# Patient Record
Sex: Female | Born: 1955 | Race: Black or African American | Hispanic: No | Marital: Married | Smoking: Current some day smoker
Health system: Southern US, Community
[De-identification: ages and names within clinical notes are randomized; demographics above are authoritative.]

## PROBLEM LIST (undated history)

## (undated) ENCOUNTER — Encounter

## (undated) ENCOUNTER — Ambulatory Visit

## (undated) ENCOUNTER — Encounter: Attending: Family Medicine | Primary: Family Medicine

## (undated) ENCOUNTER — Ambulatory Visit
Attending: Student in an Organized Health Care Education/Training Program | Primary: Student in an Organized Health Care Education/Training Program

## (undated) ENCOUNTER — Telehealth

## (undated) ENCOUNTER — Ambulatory Visit
Payer: MEDICARE | Attending: Pharmacist Clinician (PhC)/ Clinical Pharmacy Specialist | Primary: Pharmacist Clinician (PhC)/ Clinical Pharmacy Specialist

## (undated) ENCOUNTER — Encounter
Attending: Pharmacist Clinician (PhC)/ Clinical Pharmacy Specialist | Primary: Pharmacist Clinician (PhC)/ Clinical Pharmacy Specialist

## (undated) ENCOUNTER — Ambulatory Visit: Payer: MEDICARE

## (undated) ENCOUNTER — Telehealth
Attending: Student in an Organized Health Care Education/Training Program | Primary: Student in an Organized Health Care Education/Training Program

## (undated) ENCOUNTER — Ambulatory Visit: Payer: Medicare (Managed Care) | Attending: Family Medicine | Primary: Family Medicine

## (undated) ENCOUNTER — Encounter
Attending: Student in an Organized Health Care Education/Training Program | Primary: Student in an Organized Health Care Education/Training Program

## (undated) ENCOUNTER — Ambulatory Visit: Attending: Pharmacist | Primary: Pharmacist

## (undated) ENCOUNTER — Encounter: Attending: Pharmacist | Primary: Pharmacist

## (undated) ENCOUNTER — Telehealth
Attending: Pharmacist Clinician (PhC)/ Clinical Pharmacy Specialist | Primary: Pharmacist Clinician (PhC)/ Clinical Pharmacy Specialist

## (undated) ENCOUNTER — Ambulatory Visit: Payer: Medicare (Managed Care)

## (undated) ENCOUNTER — Encounter: Attending: General Practice | Primary: General Practice

## (undated) DIAGNOSIS — F17200 Nicotine dependence, unspecified, uncomplicated: Secondary | ICD-10-CM

---

## 2001-02-10 HISTORY — PX: ABDOMINAL HYSTERECTOMY: SHX81

## 2016-06-09 ENCOUNTER — Encounter: Payer: Self-pay | Admitting: Medical Oncology

## 2016-06-09 ENCOUNTER — Emergency Department: Payer: Self-pay

## 2016-06-09 ENCOUNTER — Inpatient Hospital Stay
Admission: EM | Admit: 2016-06-09 | Discharge: 2016-06-10 | DRG: 065 | Disposition: A | Discharge status: Home Health | Payer: Self-pay | Attending: Internal Medicine | Admitting: Internal Medicine

## 2016-06-09 DIAGNOSIS — R42 Dizziness and giddiness: Secondary | ICD-10-CM

## 2016-06-09 DIAGNOSIS — R270 Ataxia, unspecified: Secondary | ICD-10-CM | POA: Diagnosis present

## 2016-06-09 DIAGNOSIS — I639 Cerebral infarction, unspecified: Principal | ICD-10-CM

## 2016-06-09 DIAGNOSIS — R4701 Aphasia: Secondary | ICD-10-CM | POA: Diagnosis present

## 2016-06-09 DIAGNOSIS — G8191 Hemiplegia, unspecified affecting right dominant side: Secondary | ICD-10-CM | POA: Diagnosis present

## 2016-06-09 DIAGNOSIS — R002 Palpitations: Secondary | ICD-10-CM | POA: Diagnosis present

## 2016-06-09 DIAGNOSIS — Z880 Allergy status to penicillin: Secondary | ICD-10-CM

## 2016-06-09 DIAGNOSIS — F17210 Nicotine dependence, cigarettes, uncomplicated: Secondary | ICD-10-CM | POA: Diagnosis present

## 2016-06-09 DIAGNOSIS — R739 Hyperglycemia, unspecified: Secondary | ICD-10-CM | POA: Diagnosis present

## 2016-06-09 DIAGNOSIS — Z885 Allergy status to narcotic agent status: Secondary | ICD-10-CM

## 2016-06-09 DIAGNOSIS — R200 Anesthesia of skin: Secondary | ICD-10-CM

## 2016-06-09 DIAGNOSIS — Z716 Tobacco abuse counseling: Secondary | ICD-10-CM

## 2016-06-09 DIAGNOSIS — R251 Tremor, unspecified: Secondary | ICD-10-CM

## 2016-06-09 DIAGNOSIS — E785 Hyperlipidemia, unspecified: Secondary | ICD-10-CM | POA: Diagnosis present

## 2016-06-09 HISTORY — DX: Nicotine dependence, unspecified, uncomplicated: F17.200

## 2016-06-09 LAB — URINE DRUG SCREEN, QUALITATIVE (ARMC ONLY)
AMPHETAMINES, UR SCREEN: NOT DETECTED
BENZODIAZEPINE, UR SCRN: NOT DETECTED
Barbiturates, Ur Screen: NOT DETECTED
Cannabinoid 50 Ng, Ur ~~LOC~~: NOT DETECTED
Cocaine Metabolite,Ur ~~LOC~~: NOT DETECTED
MDMA (Ecstasy)Ur Screen: NOT DETECTED
Methadone Scn, Ur: NOT DETECTED
OPIATE, UR SCREEN: NOT DETECTED
Phencyclidine (PCP) Ur S: NOT DETECTED
Tricyclic, Ur Screen: NOT DETECTED

## 2016-06-09 LAB — COMPREHENSIVE METABOLIC PANEL
ALBUMIN: 4.4 g/dL (ref 3.5–5.0)
ALT: 22 U/L (ref 14–54)
ANION GAP: 14 (ref 5–15)
AST: 35 U/L (ref 15–41)
Alkaline Phosphatase: 59 U/L (ref 38–126)
BILIRUBIN TOTAL: 0.9 mg/dL (ref 0.3–1.2)
BUN: 8 mg/dL (ref 6–20)
CO2: 23 mmol/L (ref 22–32)
Calcium: 9.9 mg/dL (ref 8.9–10.3)
Chloride: 102 mmol/L (ref 101–111)
Creatinine, Ser: 0.84 mg/dL (ref 0.44–1.00)
GFR calc non Af Amer: >60 mL/min (ref >60)
GLUCOSE: 101 mg/dL — AB (ref 65–99)
POTASSIUM: 4.3 mmol/L (ref 3.5–5.1)
SODIUM: 139 mmol/L (ref 135–145)
TOTAL PROTEIN: 8.7 g/dL — AB (ref 6.5–8.1)

## 2016-06-09 LAB — CBC WITH DIFFERENTIAL/PLATELET
BASOS PCT: 1 %
Basophils Absolute: 0 10*3/uL (ref 0–0.1)
EOS ABS: 0 10*3/uL (ref 0–0.7)
Eosinophils Relative: 1 %
HEMATOCRIT: 44.2 % (ref 35.0–47.0)
Hemoglobin: 14.9 g/dL (ref 12.0–16.0)
Lymphocytes Relative: 20 %
Lymphs Abs: 1.3 10*3/uL (ref 1.0–3.6)
MCH: 30.1 pg (ref 26.0–34.0)
MCHC: 33.6 g/dL (ref 32.0–36.0)
MCV: 89.5 fL (ref 80.0–100.0)
MONO ABS: 0.4 10*3/uL (ref 0.2–0.9)
MONOS PCT: 7 %
Neutro Abs: 4.6 10*3/uL (ref 1.4–6.5)
Neutrophils Relative %: 71 %
Platelets: 292 10*3/uL (ref 150–440)
RBC: 4.94 MIL/uL (ref 3.80–5.20)
RDW: 13.9 % (ref 11.5–14.5)
WBC: 6.4 10*3/uL (ref 3.6–11.0)

## 2016-06-09 LAB — TSH
TSH: 1.994 u[IU]/mL (ref 0.350–4.500)
TSH: 1.988 u[IU]/mL (ref 0.350–4.500)

## 2016-06-09 LAB — TROPONIN I: Troponin I: <0.03 ng/mL (ref <0.03)

## 2016-06-09 MED ORDER — DOCUSATE SODIUM 100 MG PO CAPS
100.0000 mg | ORAL_CAPSULE | Freq: Two times a day (BID) | ORAL | Status: DC
  Administered 2016-06-09 – 2016-06-10 (×2): 100 mg via ORAL
  Filled 2016-06-09 (×2): qty 1

## 2016-06-09 MED ORDER — ONDANSETRON HCL 4 MG/2ML IJ SOLN
4.0000 mg | Freq: Once | INTRAMUSCULAR | Status: AC
  Administered 2016-06-09: 4 mg via INTRAVENOUS
  Filled 2016-06-09: qty 2

## 2016-06-09 MED ORDER — ONDANSETRON HCL 4 MG/2ML IJ SOLN: 4.0000 mg | Freq: Four times a day (QID) | INTRAMUSCULAR | Status: DC | PRN

## 2016-06-09 MED ORDER — ASPIRIN EC 325 MG PO TBEC
325.0000 mg | DELAYED_RELEASE_TABLET | Freq: Every day | ORAL | Status: DC
  Administered 2016-06-09 – 2016-06-10 (×2): 325 mg via ORAL
  Filled 2016-06-09 (×2): qty 1

## 2016-06-09 MED ORDER — SODIUM CHLORIDE 0.9 % IV SOLN
INTRAVENOUS | Status: DC
  Administered 2016-06-09 – 2016-06-10 (×3): via INTRAVENOUS

## 2016-06-09 MED ORDER — SODIUM CHLORIDE 0.9 % IV BOLUS (SEPSIS)
1000.0000 mL | Freq: Once | INTRAVENOUS | Status: AC
  Administered 2016-06-09: 1000 mL via INTRAVENOUS

## 2016-06-09 MED ORDER — MECLIZINE HCL 25 MG PO TABS: 25.0000 mg | ORAL_TABLET | Freq: Three times a day (TID) | ORAL | Status: DC | PRN

## 2016-06-09 MED ORDER — NICOTINE 14 MG/24HR TD PT24
14.0000 mg | MEDICATED_PATCH | Freq: Every day | TRANSDERMAL | Status: DC
  Administered 2016-06-09 – 2016-06-10 (×2): 14 mg via TRANSDERMAL
  Filled 2016-06-09 (×2): qty 1

## 2016-06-09 MED ORDER — ATORVASTATIN CALCIUM 20 MG PO TABS
40.0000 mg | ORAL_TABLET | Freq: Every day | ORAL | Status: DC
  Administered 2016-06-09 – 2016-06-10 (×2): 40 mg via ORAL
  Filled 2016-06-09 (×2): qty 2

## 2016-06-09 MED ORDER — ACETAMINOPHEN 325 MG PO TABS: 650.0000 mg | ORAL_TABLET | Freq: Four times a day (QID) | ORAL | Status: DC | PRN

## 2016-06-09 MED ORDER — SODIUM CHLORIDE 0.9% FLUSH
3.0000 mL | Freq: Two times a day (BID) | INTRAVENOUS | Status: DC
  Administered 2016-06-09 – 2016-06-10 (×2): 3 mL via INTRAVENOUS

## 2016-06-09 MED ORDER — ONDANSETRON HCL 4 MG PO TABS: 4.0000 mg | ORAL_TABLET | Freq: Four times a day (QID) | ORAL | Status: DC | PRN

## 2016-06-09 MED ORDER — ACETAMINOPHEN 650 MG RE SUPP: 650.0000 mg | Freq: Four times a day (QID) | RECTAL | Status: DC | PRN

## 2016-06-09 MED ORDER — ENOXAPARIN SODIUM 40 MG/0.4ML ~~LOC~~ SOLN
40.0000 mg | SUBCUTANEOUS | Status: DC
  Administered 2016-06-09: 40 mg via SUBCUTANEOUS
  Filled 2016-06-09: qty 0.4

## 2016-06-09 NOTE — ED Notes (Signed)
Pt is A&O x4 with no shaking noted - pt denies any weakness to either side and denies N/V at this time - pt denies any pain

## 2016-06-09 NOTE — ED Provider Notes (Signed)
[facility] Emergency Department Provider Note  ____________________________________________   First MD Initiated Contact with Patient 06/09/16 0920     (approximate)  I have reviewed the triage vital signs and the nursing notes.   HISTORY  Chief Complaint Dizziness; Shaking; and Nausea   HPI Nicole Moses is a 61 y.o. female without any chronic medical issues who is presenting to the emergency department with sudden onset tremors. She says that she was about to do her dishes in her home just prior to arrival today when she started experiencing shaking to her bilateral upper extremities with the right being greater than the left. She says that she was not anxious. She says that she does not drink on a regular basis although she did 3 beers yesterday because it was her son's 81th birthday. She is denying any pain. Says that she does feel lightheaded and especially when she lays her head back feels lightheaded with nausea. She says that she also has tingling in her feet, bilaterally. Says that she feels palpitations in her chest but with no pain or shortness of breath. Says that she does not feel anxious or have a history of anxiety or psychiatric disorder. Denies any drug use.   History reviewed. No pertinent past medical history.  There are no active problems to display for this patient.   No past surgical history on file.  Prior to Admission medications   Not on File    Allergies Codeine and Penicillins  No family history on file.  Social History Social History  Substance Use Topics  . Smoking status: Not on file  . Smokeless tobacco: Not on file  . Alcohol use Not on file    Review of Systems  Constitutional: No fever/chills Eyes: No visual changes. ENT: No sore throat. Cardiovascular: Denies chest pain. Respiratory: Denies shortness of breath. Gastrointestinal: No abdominal pain. no vomiting.  No diarrhea.  No  constipation. Genitourinary: Negative for dysuria. Musculoskeletal: Negative for back pain. Skin: Negative for rash. Neurological: Negative for headaches, focal weakness or numbness.   ____________________________________________   PHYSICAL EXAM:  VITAL SIGNS: ED Triage Vitals  Enc Vitals Group     BP 06/09/16 0915 (!) 160/98     Pulse Rate 06/09/16 0915 (!) 105     Resp 06/09/16 0915 20     Temp 06/09/16 0915 97.5 F (36.4 C)     Temp Source 06/09/16 0915 Oral     SpO2 06/09/16 0915 100 %     Weight 06/09/16 0916 145 lb (65.8 kg)     Height 06/09/16 0916  (1.499 m)     Head Circumference --      Peak Flow --      Pain Score --      Pain Loc --      Pain Edu? --      Excl. in GC? --     Constitutional: Alert and oriented.  Eyes: Conjunctivae are normal. PERRL. EOMI. Head: Atraumatic. Nose: No congestion/rhinnorhea. Mouth/Throat: Mucous membranes are moist.   Neck: No stridor.   Cardiovascular: tachycardic, regular rhythm. Grossly normal heart sounds.  Good peripheral circulation with equal and bilateral dorsalis pedis pulses. Respiratory: Normal respiratory effort.  No retractions. Lungs CTAB. Gastrointestinal: Soft and nontender. No distention.No CVA tenderness. Musculoskeletal: No lower extremity tenderness nor edema.  No joint effusions. Neurologic:  Normal speech and language. No gross focal neurologic deficits are appreciated. Full strength throughout but with tremulousness with the right upper and lower  extremities being greater the left side. Tremor stops intermittently but is worse with movement.No sensory deficit to light touch the patient says that she does feel "tingling in her bilateral feet. 5 out of 5 strength throughout. No facial asymmetry. Skin:  Skin is warm, dry and intact. No rash noted. Psychiatric: Mood and affect are normal. Speech and behavior are normal.  ____________________________________________   LABS (all labs ordered are listed,  but only abnormal results are displayed)  Labs Reviewed  COMPREHENSIVE METABOLIC PANEL - Abnormal; Notable for the following:       Result Value   Glucose, Bld 101 (*)    Total Protein 8.7 (*)    All other components within normal limits  CBC WITH DIFFERENTIAL/PLATELET  TROPONIN I  URINE DRUG SCREEN, QUALITATIVE ([facility] ONLY)  TSH   ____________________________________________  EKG  ED ECG REPORT I, Arelia Longest, the attending physician, personally viewed and interpreted this ECG.   Date: 06/09/2016  EKG Time: 0913  Rate: 104  Rhythm: sinus tachycardia  Axis: Left  Intervals:none  ST&T Change: No ST segment elevation or depression. No abnormal T-wave inversion.  ____________________________________________  RADIOLOGY    DG Chest 1 View (Final result)  Result time 06/09/16 09:56:39  Final result by Florencia Reasons, MD (06/09/16 09:56:39)           Narrative:   CLINICAL DATA: 61 year old female with history of shakiness and dizziness. Vomiting.  EXAM: CHEST 1 VIEW  COMPARISON: No priors.  FINDINGS: Diffuse peribronchial cuffing, most evident throughout the mid to lower lungs bilaterally where there is also diffuse interstitial prominence. Lung volumes are slightly low. No consolidative airspace disease. No pleural effusions. No pneumothorax. No pulmonary nodule or mass noted. Pulmonary vasculature and the cardiomediastinal silhouette are within normal limits. Atherosclerosis in the thoracic aorta.  IMPRESSION: 1. The appearance the chest suggests an acute bronchitis. The possibility of underlying interstitial lung disease should also be considered given the low lung volumes and basal predominance of findings, but is not strongly favored at this time. Repeat standing PA and lateral chest radiograph is recommended in 2-3 weeks after treatment to ensure resolution of these findings.   Electronically Signed By: Trudie Reed M.D. On:  06/09/2016 09:56            CT Head Wo Contrast (Final result)  Result time 06/09/16 09:46:52  Final result by Herbie Baltimore, MD (06/09/16 09:46:52)           Narrative:   CLINICAL DATA: Sudden onset nausea, headache, and tremors this morning.  EXAM: CT HEAD WITHOUT CONTRAST  TECHNIQUE: Contiguous axial images were obtained from the base of the skull through the vertex without intravenous contrast.  COMPARISON: None.  FINDINGS: Brain: The brainstem, cerebellum, cerebral peduncles, thalami, basal ganglia, basilar cisterns, and ventricular system appear within normal limits. No intracranial hemorrhage, mass lesion, or acute CVA.  Vascular: Unremarkable  Skull: Unremarkable  Sinuses/Orbits: Unremarkable  Other: No supplemental non-categorized findings.  IMPRESSION: 1. No significant abnormality is observed to explain the patient's symptoms.   Electronically Signed By: Gaylyn Rong M.D. On: 06/09/2016 09:46            ____________________________________________   PROCEDURES  Procedure(s) performed:   Procedures  Critical Care performed:   ____________________________________________   INITIAL IMPRESSION / ASSESSMENT AND PLAN / ED COURSE  Pertinent labs & imaging results that were available during my care of the patient were reviewed by me and considered in my medical decision making (see chart for  details).  ----------------------------------------- 12:59 PM on 06/09/2016 -----------------------------------------  Patient's tremor has improved and there is no tremor at rest. However there is still a tremor with movement of the limbs. Call Dr. Thad Ranger for consult who examined the patient. The patient is now reporting right-sided hemi-sensory loss. Dr. Thad Ranger recommends further workup as an inpatient for MRI. Explained this plan to the patient is understanding and willing to comply.       ____________________________________________   FINAL CLINICAL IMPRESSION(S) / ED DIAGNOSES  Numbness. Tremor.    NEW MEDICATIONS STARTED DURING THIS VISIT:  New Prescriptions   No medications on file     Note:  This document was prepared using Dragon voice recognition software and may include unintentional dictation errors.    Myrna Blazer, MD 06/09/16 [phone]

## 2016-06-09 NOTE — Consult Note (Signed)
Referring Physician: Pershing Proud    Chief Complaint: Tremors  HPI: Nicole Moses is an 61 y.o. female with no significant past medical history who provides a inconsistent history today.  She reports to me that she was washing dishes today when she became dizzy, which she describes as the room spinning.  She then reports that she experienced blurry vision and right sided numbness.  She fell at that point and possibly passed out but she was abler to get to the phone and call EMS.  Patient remained tremulous and was shaking on presentation to the ED, right greater than left.    Date last known well: Date: 06/09/2016 Time last known well: Time: 07:30 tPA Given: No: Did not reveal focal symptoms until later in the day outside of time window  Past Medical History:  Diagnosis Date  . Smoking    1975 - 1 pack lasts 3/5 days    Past Surgical History:  Procedure Laterality Date  . ABDOMINAL HYSTERECTOMY Bilateral 2003  . CESAREAN SECTION Bilateral 1978    Family History  Problem Relation Age of Onset  . Breast cancer Mother    Social History:  reports that she has been smoking Cigarettes.  She has a 43.00 pack-year smoking history. She has never used smokeless tobacco. She reports that she drinks about 2.4 oz of alcohol per week . Her drug history is not on file.  Allergies:  Allergies  Allergen Reactions  . Codeine Hives and Swelling    Tongue swelling  . Penicillins Hives and Swelling    Has patient had a PCN reaction causing immediate rash, facial/tongue/throat swelling, SOB or lightheadedness with hypotension: Yes Has patient had a PCN reaction causing severe rash involving mucus membranes or skin necrosis: No Has patient had a PCN reaction that required hospitalization No Has patient had a PCN reaction occurring within the last 10 years: Yes If all of the above answers are "NO", then may proceed with Cephalosporin use.     Medications:  I have reviewed the patient's current  medications. Prior to Admission:  No prescriptions prior to admission.    ROS: History obtained from the patient  General ROS: negative for - chills, fatigue, fever, night sweats, weight gain or weight loss Psychological ROS: negative for - behavioral disorder, hallucinations, memory difficulties, mood swings or suicidal ideation Ophthalmic ROS: as noted in HPI ENT ROS: negative for - epistaxis, nasal discharge, oral lesions, sore throat, tinnitus or vertigo Allergy and Immunology ROS: negative for - hives or itchy/watery eyes Hematological and Lymphatic ROS: negative for - bleeding problems, bruising or swollen lymph nodes Endocrine ROS: negative for - galactorrhea, hair pattern changes, polydipsia/polyuria or temperature intolerance Respiratory ROS: negative for - cough, hemoptysis, shortness of breath or wheezing Cardiovascular ROS: negative for - chest pain, dyspnea on exertion, edema or irregular heartbeat Gastrointestinal ROS: negative for - abdominal pain, diarrhea, hematemesis, nausea/vomiting or stool incontinence Genito-Urinary ROS: negative for - dysuria, hematuria, incontinence or urinary frequency/urgency Musculoskeletal ROS: right arm pain Neurological ROS: as noted in HPI Dermatological ROS: negative for rash and skin lesion changes  Physical Examination: Blood pressure (!) 144/67, pulse 74, temperature 98.4 F (36.9 C), temperature source Oral, resp. rate 18, height  (1.499 m), weight 66.1 kg (145 lb 12.8 oz), SpO2 99 %.  HEENT-  Normocephalic, no lesions, without obvious abnormality.  Normal external eye and conjunctiva.  Normal TM's bilaterally.  Normal auditory canals and external ears. Normal external nose, mucus membranes and septum.  Normal pharynx.  Cardiovascular- S1, S2 normal, pulses palpable throughout   Lungs- chest clear, no wheezing, rales, normal symmetric air entry Abdomen- soft, non-tender; bowel sounds normal; no masses,  no  organomegaly Extremities- no edema Lymph-no adenopathy palpable Musculoskeletal-no joint tenderness, deformity or swelling Skin-warm and dry, no hyperpigmentation, vitiligo, or suspicious lesions  Neurological Examination   Mental Status: Alert, oriented, thought content appropriate.  Speech fluent without evidence of aphasia.  Able to follow 3 step commands without difficulty. Cranial Nerves: II: Discs flat bilaterally; Visual fields grossly normal, pupils equal, round, reactive to light and accommodation III,IV, VI: ptosis not present, extra-ocular motions intact bilaterally V,VII: smile symmetric, facial light touch sensation decreased on the right VIII: hearing normal bilaterally IX,X: gag reflex present XI: bilateral shoulder shrug XII: midline tongue extension Motor: Full strength throughout.  Initially unable to grip with the right hand but when trying to walk patient held cords by gripping strongly with the right hand.  Mirror movements Sensory: Pinprick and light touch decreased on the right Deep Tendon Reflexes: 2+ and symmetric throughout Plantars: Right: downgoing   Left: downgoing Cerebellar: Normal finger-to-nose and normal heel-to-shin testing bilaterally.  Patient tremulous with movement but when distracted all tremors cease.   Gait: normal gait and station    Laboratory Studies:  Basic Metabolic Panel:  Recent Labs Lab 06/09/16 0915  NA 139  K 4.3  CL 102  CO2 23  GLUCOSE 101*  BUN 8  CREATININE 0.84  CALCIUM 9.9    Liver Function Tests:  Recent Labs Lab 06/09/16 0915  AST 35  ALT 22  ALKPHOS 59  BILITOT 0.9  PROT 8.7*  ALBUMIN 4.4   No results for input(s): LIPASE, AMYLASE in the last 168 hours. No results for input(s): AMMONIA in the last 168 hours.  CBC:  Recent Labs Lab 06/09/16 0915  WBC 6.4  NEUTROABS 4.6  HGB 14.9  HCT 44.2  MCV 89.5  PLT 292    Cardiac Enzymes:  Recent Labs Lab 06/09/16 0915  TROPONINI <0.03     BNP: Invalid input(s): POCBNP  CBG: No results for input(s): GLUCAP in the last 168 hours.  Microbiology: No results found for this or any previous visit.  Coagulation Studies: No results for input(s): LABPROT, INR in the last 72 hours.  Urinalysis: No results for input(s): COLORURINE, LABSPEC, PHURINE, GLUCOSEU, HGBUR, BILIRUBINUR, KETONESUR, PROTEINUR, UROBILINOGEN, NITRITE, LEUKOCYTESUR in the last 168 hours.  Invalid input(s): APPERANCEUR  Lipid Panel: No results found for: CHOL, TRIG, HDL, CHOLHDL, VLDL, LDLCALC  HgbA1C: No results found for: HGBA1C  Urine Drug Screen:     Component Value Date/Time   LABOPIA NONE DETECTED 06/09/2016 1036   COCAINSCRNUR NONE DETECTED 06/09/2016 1036   LABBENZ NONE DETECTED 06/09/2016 1036   AMPHETMU NONE DETECTED 06/09/2016 1036   THCU NONE DETECTED 06/09/2016 1036   LABBARB NONE DETECTED 06/09/2016 1036    Alcohol Level: No results for input(s): ETH in the last 168 hours.  Other results: EKG: sinus tachycardia at 104 bpm.  Imaging: Dg Chest 1 View  Result Date: 06/09/2016 CLINICAL DATA:  60 year old female with history of shakiness and dizziness. Vomiting. EXAM: CHEST 1 VIEW COMPARISON:  No priors. FINDINGS: Diffuse peribronchial cuffing, most evident throughout the mid to lower lungs bilaterally where there is also diffuse interstitial prominence. Lung volumes are slightly low. No consolidative airspace disease. No pleural effusions. No pneumothorax. No pulmonary nodule or mass noted. Pulmonary vasculature and the cardiomediastinal silhouette are within normal limits. Atherosclerosis in the thoracic aorta. IMPRESSION:  1. The appearance the chest suggests an acute bronchitis. The possibility of underlying interstitial lung disease should also be considered given the low lung volumes and basal predominance of findings, but is not strongly favored at this time. Repeat standing PA and lateral chest radiograph is recommended in 2-3 weeks  after treatment to ensure resolution of these findings. Electronically Signed   By: Trudie Reed M.D.   On: 06/09/2016 09:56   Ct Head Wo Contrast  Result Date: 06/09/2016 CLINICAL DATA:  Sudden onset nausea, headache, and tremors this morning. EXAM: CT HEAD WITHOUT CONTRAST TECHNIQUE: Contiguous axial images were obtained from the base of the skull through the vertex without intravenous contrast. COMPARISON:  None. FINDINGS: Brain: The brainstem, cerebellum, cerebral peduncles, thalami, basal ganglia, basilar cisterns, and ventricular system appear within normal limits. No intracranial hemorrhage, mass lesion, or acute CVA. Vascular: Unremarkable Skull: Unremarkable Sinuses/Orbits: Unremarkable Other: No supplemental non-categorized findings. IMPRESSION: 1. No significant abnormality is observed to explain the patient's symptoms. Electronically Signed   By: Gaylyn Rong M.D.   On: 06/09/2016 09:46    Assessment: 61 y.o. female presenting with tremors, right sided numbness and dizziness.  Head CT reviewed and shows no acute changes.  Many findings on neurological examination are somewhat functional at this time but can not rule out a lacunar event.  Further work up recommended.    Stroke Risk Factors - smoking  Plan: 1. HgbA1c, fasting lipid panel 2. MRI, MRA  of the brain without contrast 3. PT consult, OT consult, Speech consult 4. Echocardiogram 5. Carotid dopplers 6. Prophylactic therapy-Antiplatelet med: Aspirin - dose  daily 7. NPO until RN stroke swallow screen 8. Telemetry monitoring 9. Frequent neuro checks    Thana Farr, MD Neurology [phone] 06/09/2016, 4:16 PM

## 2016-06-09 NOTE — ED Triage Notes (Signed)
Pt reports that she woke up this am feeling normal, when she began washing dishes after eating breakfast she began to feel shaky (mostly on the rt side) and began to feel dizzy. Pt vomit x 1 pta. Pt is A/O x 4. Denies pain.

## 2016-06-09 NOTE — H&P (Signed)
[facility] [place] [facility] [employer]   PATIENT NAME: Nicole Moses    MR#:  [id number]  DATE OF BIRTH:  1956/01/20  DATE OF ADMISSION:  06/09/2016  PRIMARY CARE PHYSICIAN: No PCP Per Patient   REQUESTING/REFERRING PHYSICIAN:   CHIEF COMPLAINT:   Chief Complaint  Patient presents with  . Dizziness  . Shaking  . Nausea    HISTORY OF PRESENT ILLNESS: Ashelynn Moses  is a 61 y.o. female with Not significant past medical history who presents to the hospital with complaints of dizziness, fall of right-sided weakness and numbness, blurry vision, nausea, shakiness. According to the patient, she was doing dishes in the morning, when suddenly room started spinning around, patient tried to grab the cabinets, however, fell down, briefly passed out, whenever she woke up she was somewhat confused and noted to have right-sided shakiness/tremors. It lasted at least 3 hours until she came to emergency room. She was also having significantly blurry vision and some slurred speech as she had to crawl to get to the phone and was brought to emergency room for evaluation and treatment where she was noted to have some right leg numbness from the knee down to the foot as well as right upper extremity weakness. Weak grip. Hospitalist services were contacted for admission.  PAST MEDICAL HISTORY:  Tobacco abuse  PAST SURGICAL HISTORY: No past surgical history on file.  SOCIAL HISTORY:  Social History  Substance Use Topics  . Smoking status: Not on file  . Smokeless tobacco: Not on file  . Alcohol use Not on file    FAMILY HISTORY: Patient's mother had cancer, father CHF  DRUG ALLERGIES:  Allergies  Allergen Reactions  . Codeine Hives and Swelling    Tongue swelling  . Penicillins Hives and Swelling    Has patient had a PCN reaction causing immediate rash, facial/tongue/throat swelling, SOB or lightheadedness with hypotension: Yes Has patient had a PCN reaction  causing severe rash involving mucus membranes or skin necrosis: No Has patient had a PCN reaction that required hospitalization No Has patient had a PCN reaction occurring within the last 10 years: Yes If all of the above answers are "NO", then may proceed with Cephalosporin use.     Review of Systems  Constitutional: Positive for malaise/fatigue. Negative for chills, fever and weight loss.  HENT: Negative for congestion.   Eyes: Positive for blurred vision. Negative for double vision.  Respiratory: Positive for wheezing. Negative for cough, sputum production and shortness of breath.   Cardiovascular: Positive for palpitations. Negative for chest pain, orthopnea, leg swelling and PND.  Gastrointestinal: Positive for nausea and vomiting. Negative for abdominal pain, blood in stool, constipation and diarrhea.  Genitourinary: Positive for dysuria and urgency. Negative for frequency and hematuria.  Musculoskeletal: Positive for falls.  Neurological: Positive for dizziness, tremors, sensory change, speech change, focal weakness, loss of consciousness and weakness. Negative for headaches.  Endo/Heme/Allergies: Does not bruise/bleed easily.  Psychiatric/Behavioral: Negative for depression. The patient does not have insomnia.     MEDICATIONS AT HOME:  Prior to Admission medications   Not on File      PHYSICAL EXAMINATION:   VITAL SIGNS: Blood pressure (!) 145/76, pulse 86, temperature 97.5 F (36.4 C), temperature source Oral, resp. rate 16, height  (1.499 m), weight 65.8 kg (145 lb), SpO2 99 %.  GENERAL:  61 y.o.-year-old patient lying in the bed In moderate distress, tremulous, anxious .  EYES: Pupils equal, round, reactive to light  and accommodation. No scleral icterus. Extraocular muscles intact. Right sided nystagmus HEENT: Head atraumatic, normocephalic. Oropharynx and nasopharynx clear.  NECK:  Supple, no jugular venous distention. No thyroid enlargement, no tenderness.    LUNGS: Normal breath sounds bilaterally, no wheezing, rales,rhonchi or crepitation. No use of accessory muscles of respiration. Diminished breath sounds bilaterally, more on the right, poor effort CARDIOVASCULAR: S1, S2. The rhythm is regular. No murmurs, rubs, or gallops.  ABDOMEN: Soft, nontender, nondistended. Bowel sounds present. No organomegaly or mass.  EXTREMITIES: No pedal edema, cyanosis, or clubbing.  NEUROLOGIC: Cranial nerves II through XII are grossly intact, somewhat shallow right nasolabial crease . Muscle strength 4/5 in right-sided extremities, 5/5 in left-sided extremities. Weak right shoulder shrug. Sensation diminished to light touch in right lower extremity from the knee down to the foot. Gait not checked. Some difficulty expressing herself, stuttering PSYCHIATRIC: The patient is alert and oriented x 3. Anxious appearance  SKIN: No obvious rash, lesion, or ulcer.   LABORATORY PANEL:   CBC  Recent Labs Lab 06/09/16 0915  WBC 6.4  HGB 14.9  HCT 44.2  PLT 292  MCV 89.5  MCH 30.1  MCHC 33.6  RDW 13.9  LYMPHSABS 1.3  MONOABS 0.4  EOSABS 0.0  BASOSABS 0.0   ------------------------------------------------------------------------------------------------------------------  Chemistries   Recent Labs Lab 06/09/16 0915  NA 139  K 4.3  CL 102  CO2 23  GLUCOSE 101*  BUN 8  CREATININE 0.84  CALCIUM 9.9  AST 35  ALT 22  ALKPHOS 59  BILITOT 0.9   ------------------------------------------------------------------------------------------------------------------  Cardiac Enzymes  Recent Labs Lab 06/09/16 0915  TROPONINI <0.03   ------------------------------------------------------------------------------------------------------------------  RADIOLOGY: Dg Chest 1 View  Result Date: 06/09/2016 CLINICAL DATA:  61 year old female with history of shakiness and dizziness. Vomiting. EXAM: CHEST 1 VIEW COMPARISON:  No priors. FINDINGS: Diffuse peribronchial  cuffing, most evident throughout the mid to lower lungs bilaterally where there is also diffuse interstitial prominence. Lung volumes are slightly low. No consolidative airspace disease. No pleural effusions. No pneumothorax. No pulmonary nodule or mass noted. Pulmonary vasculature and the cardiomediastinal silhouette are within normal limits. Atherosclerosis in the thoracic aorta. IMPRESSION: 1. The appearance the chest suggests an acute bronchitis. The possibility of underlying interstitial lung disease should also be considered given the low lung volumes and basal predominance of findings, but is not strongly favored at this time. Repeat standing PA and lateral chest radiograph is recommended in 2-3 weeks after treatment to ensure resolution of these findings. Electronically Signed   By: Trudie Reed M.D.   On: 06/09/2016 09:56   Ct Head Wo Contrast  Result Date: 06/09/2016 CLINICAL DATA:  Sudden onset nausea, headache, and tremors this morning. EXAM: CT HEAD WITHOUT CONTRAST TECHNIQUE: Contiguous axial images were obtained from the base of the skull through the vertex without intravenous contrast. COMPARISON:  None. FINDINGS: Brain: The brainstem, cerebellum, cerebral peduncles, thalami, basal ganglia, basilar cisterns, and ventricular system appear within normal limits. No intracranial hemorrhage, mass lesion, or acute CVA. Vascular: Unremarkable Skull: Unremarkable Sinuses/Orbits: Unremarkable Other: No supplemental non-categorized findings. IMPRESSION: 1. No significant abnormality is observed to explain the patient's symptoms. Electronically Signed   By: Gaylyn Rong M.D.   On: 06/09/2016 09:46    EKG: Orders placed or performed during the hospital encounter of 06/09/16  . EKG 12-Lead  . EKG 12-Lead  EKG in the emergency room revealed sinus tachycardia at rate of 104 bpm, left atrial enlargement, left axis deviation are as are prime in V1  and V2, probably normal MRI and, according to EKG  criteria, borderline T-wave abnormalities in anterior leads  IMPRESSION AND PLAN:  Active Problems:   Acute CVA (cerebrovascular accident) (HCC)   Expressive aphasia   Dizziness   Hyperglycemia   Tobacco abuse counseling   Stroke (cerebrum) (HCC) #1. Acute stroke with right-sided weakness, expressive aphasia, ataxia, admit patient to medical floor, initiate her on aspirin, Lipitor, meclizine as needed, get speech, occupational, physical therapist, consultations, get TSH, hemoglobin, A1c, lipid panel, neurologist consultation, get MRI of the brain, carotid ultrasound and echocardiogram #2. Dizziness, try meclizine, physical therapist consultation, may need acute rehabilitation #3. Hyperglycemia, get hemoglobin A1c to rule out diabetes mellitus #4. Tobacco abuse counseling, discussed this patient for 4 minutes, nicotine replacement therapy will be initiated, patient is agreeable #5, palpitations, continue telemetry to rule out atrial fibrillation and need for anticoagulation   All the records are reviewed and case discussed with ED provider. Management plans discussed with the patient, family and they are in agreement.  CODE STATUS: Code Status History    This patient does not have a recorded code status. Please follow your organizational policy for patients in this situation.       TOTAL TIME TAKING CARE OF THIS PATIENT: 50 minutes.    Katharina Caper M.D on 06/09/2016 at 1:49 PM  Between 7am to 6pm - Pager - [phone] After 6pm go to [email or web address] - password [email or web address] [facility]  [facility]  Office  [phone]  CC: Primary care physician; No PCP Per Patient

## 2016-06-09 NOTE — Evaluation (Signed)
Physical Therapy Evaluation Patient Details Name: Nicole Moses MRN: [id number] DOB: May 19, 1955 Today's Date: 06/09/2016   History of Present Illness  61 yo Female came to ED after syncope episode with right sided weakness. CT scan was negative for abnormality; She is currently on telemetry and is waiting on MRI and additional tests to determine cause of syncope episode. Patient reports being independent prior to admittance;   Clinical Impression  61 yo Female came to ED with right sided weakness s/p syncope and dizziness. Patient initially reports feeling vertigo. However during PT eval describes dizziness as unsteadiness. Patient reports being independent in all ADLs and work tasks prior to admittance. Currently she is mod I bed mobility (requiring increased time); requires CGA with sit<>Stand transfers although was mod I for toilet transfer with good safety awareness. Patient ambulated 160 feet without AD, min A demonstrating increased unsteadiness initially, being able to progress to better reciprocal gait pattern with less tremors with additional gait. Patient is slow with ambulation. She tested as a high fall risk with 5 times sit<>Stand of 42 sec. Patient demonstrates weakness on right side however is inconsistent with RUE/RLE use. She would benefit from skilled PT intervention to improve strength, balance and gait safety; Recommend outpatient PT upon discharge to further reach goals.     Follow Up Recommendations Outpatient PT    Equipment Recommendations   (defer to progress with PT; may benefit from walking AD depending on progress)    Recommendations for Other Services       Precautions / Restrictions Precautions Precautions: Fall Restrictions Weight Bearing Restrictions: No      Mobility  Bed Mobility Overal bed mobility: Modified Independent             General bed mobility comments: uses bed rail and is slower with bed mobility but able to transition mod I with  good safety awareness;   Transfers Overall transfer level: Needs assistance Equipment used: None Transfers: Sit to/from Stand Sit to Stand: Min guard         General transfer comment: initial transfer patient very unsteady requiring CGA for safety demonstrating heavy posterior lean; 5 times sit<>stand 42 sec from bed without HHA (>10 sec indicates increased risk for falls) Patient is mod I for toilet transfer demonstrating safe UE use and good LE control; She was able to use RUE to clean self and to pull up undergarments without difficulty;   Ambulation/Gait Ambulation/Gait assistance: Min assist Ambulation Distance (Feet): 160 Feet Assistive device: None Gait Pattern/deviations: Step-through pattern;Decreased step length - right;Decreased step length - left;Decreased dorsiflexion - right;Decreased dorsiflexion - left;Trunk flexed;Decreased weight shift to right Gait velocity: decreased   General Gait Details: unsteady gait pattern initially, very slow with flexed posture and stiffened LE; however with increased distance, able to demonstrate better reciprocal walking with less unsteadiness and better hip/knee flexion/extension during swing;   Stairs            Wheelchair Mobility    Modified Rankin (Stroke Patients Only)       Balance Overall balance assessment: Needs assistance Sitting-balance support: Single extremity supported;Feet supported Sitting balance-Leahy Scale: Good Sitting balance - Comments: dynamic balance is poor with posterior lean during LE movement;  Postural control: Posterior lean   Standing balance-Leahy Scale: Fair Standing balance comment: requires CGA to min A for balance without AD;  Pertinent Vitals/Pain Pain Assessment: No/denies pain    Home Living Family/patient expects to be discharged to:: Private residence Living Arrangements: Spouse/significant other Available Help at Discharge: Family Type  of Home: House Home Access: Ramped entrance     Home Layout: One level Home Equipment: Grab bars - toilet;Grab bars - tub/shower      Prior Function Level of Independence: Independent         Comments: did not use any assistive device; 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 She reports walking 1.5 miles per day     Hand Dominance   Dominant Hand: Right    Extremity/Trunk Assessment   Upper Extremity Assessment Upper Extremity Assessment: RUE deficits/detail;LUE deficits/detail RUE Deficits / Details: ROM is WNL; MMT 3/5; however patient typically doesn't use RUE for pushing up out of bed; but when going to bathroom she was able to use RUE to clean self with good coordination; inconsistent use of RUE;  LUE Deficits / Details: ROM is [facility]; strength: 4/5; intact light touch sensation; does exhibit tremors when walking which patient expressed, "I am fearful of falling"     Lower Extremity Assessment Lower Extremity Assessment: RLE deficits/detail;LLE deficits/detail RLE Deficits / Details: ROM is [facility]; MMT: hip 4-/5, knee 3-/5, ankle 3/5; intact light touch sensation;  LLE Deficits / Details: ROM is WNL; strength 5/5; however during gait/transfers, patient is inconsistent with using LLE exhibiting decreased knee ROM during gait;     Cervical / Trunk Assessment Cervical / Trunk Assessment: Normal  Communication   Communication: No difficulties  Cognition Arousal/Alertness: Awake/alert Behavior During Therapy: WFL for tasks assessed/performed Overall Cognitive Status: Within Functional Limits for tasks assessed                                 General Comments: occasionally forgets words but is able to follow direction and is able to answer questions correctly;       General Comments      Exercises     Assessment/Plan    PT Assessment Patient needs  continued PT services  PT Problem List Decreased strength;Decreased activity tolerance;Decreased balance;Decreased mobility;Decreased safety awareness       PT Treatment Interventions DME instruction;Gait training;Functional mobility training;Neuromuscular re-education;Balance training;Therapeutic exercise;Therapeutic activities;Patient/family education    PT Goals (Current goals can be found in the Care Plan section)  Acute Rehab PT Goals Patient Stated Goal: "I want to go home and get better" PT Goal Formulation: With patient Time For Goal Achievement: 06/23/16 Potential to Achieve Goals: Good    Frequency 7X/week   Barriers to discharge   none; has good family support and ramped entrance;     Co-evaluation               AM-PAC PT "6 Clicks" Daily Activity  Outcome Measure Difficulty turning over in bed (including adjusting bedclothes, sheets and blankets)?: A Little Difficulty moving from lying on back to sitting on the side of the bed? : A Little Difficulty sitting down on and standing up from a chair with arms (e.g., wheelchair, bedside commode, etc,.)?: A Little Help needed moving to and from a bed to chair (including a wheelchair)?: A Little Help needed walking in hospital room?: A Little Help needed climbing 3-5 steps with a railing? : A Little 6 Click Score: 18    End of  Session Equipment Utilized During Treatment: Gait belt Activity Tolerance: Patient tolerated treatment well;No increased pain Patient left: in bed;with call bell/phone within reach;with bed alarm set Nurse Communication: Mobility status PT Visit Diagnosis: Unsteadiness on feet (R26.81);Muscle weakness (generalized) (M62.81)    Time: 0981-1914 PT Time Calculation (min) (ACUTE ONLY): 30 min   Charges:   PT Evaluation $PT Eval Moderate Complexity: 1 Procedure     PT G Codes:          Jeanita Carneiro PT, DPT 06/09/2016, 5:26 PM

## 2016-06-10 ENCOUNTER — Inpatient Hospital Stay: Payer: Self-pay

## 2016-06-10 ENCOUNTER — Inpatient Hospital Stay (HOSPITAL_COMMUNITY)
Admit: 2016-06-10 | Discharge: 2016-06-10 | Disposition: A | Discharge status: Home / Self Care | Payer: Self-pay | Attending: Internal Medicine | Admitting: Internal Medicine

## 2016-06-10 DIAGNOSIS — I6350 Cerebral infarction due to unspecified occlusion or stenosis of unspecified cerebral artery: Secondary | ICD-10-CM

## 2016-06-10 LAB — BASIC METABOLIC PANEL
Anion gap: 6 (ref 5–15)
BUN: 9 mg/dL (ref 6–20)
CO2: 27 mmol/L (ref 22–32)
Calcium: 8.6 mg/dL — ABNORMAL LOW (ref 8.9–10.3)
Chloride: 106 mmol/L (ref 101–111)
Creatinine, Ser: 0.72 mg/dL (ref 0.44–1.00)
GFR calc Af Amer: >60 mL/min (ref >60)
GLUCOSE: 91 mg/dL (ref 65–99)
POTASSIUM: 4.2 mmol/L (ref 3.5–5.1)
Sodium: 139 mmol/L (ref 135–145)

## 2016-06-10 LAB — LIPID PANEL
Cholesterol: 225 mg/dL — ABNORMAL HIGH (ref 0–200)
HDL: 78 mg/dL (ref >40)
LDL CALC: 126 mg/dL — AB (ref 0–99)
TRIGLYCERIDES: 103 mg/dL (ref <150)
Total CHOL/HDL Ratio: 2.9 RATIO
VLDL: 21 mg/dL (ref 0–40)

## 2016-06-10 LAB — ECHOCARDIOGRAM COMPLETE
Height: 59 in
Weight: 2332.8 oz

## 2016-06-10 LAB — CBC
HEMATOCRIT: 38.1 % (ref 35.0–47.0)
Hemoglobin: 12.7 g/dL (ref 12.0–16.0)
MCH: 30.3 pg (ref 26.0–34.0)
MCHC: 33.2 g/dL (ref 32.0–36.0)
MCV: 91.2 fL (ref 80.0–100.0)
Platelets: 223 10*3/uL (ref 150–440)
RBC: 4.18 MIL/uL (ref 3.80–5.20)
RDW: 14 % (ref 11.5–14.5)
WBC: 4.8 10*3/uL (ref 3.6–11.0)

## 2016-06-10 LAB — URINALYSIS, COMPLETE (UACMP) WITH MICROSCOPIC
BILIRUBIN URINE: NEGATIVE
GLUCOSE, UA: NEGATIVE mg/dL
HGB URINE DIPSTICK: NEGATIVE
Ketones, ur: NEGATIVE mg/dL
LEUKOCYTES UA: NEGATIVE
NITRITE: NEGATIVE
PH: 7 (ref 5.0–8.0)
Protein, ur: NEGATIVE mg/dL
SPECIFIC GRAVITY, URINE: 1.012 (ref 1.005–1.030)

## 2016-06-10 LAB — TROPONIN I: Troponin I: <0.03 ng/mL (ref <0.03)

## 2016-06-10 LAB — HEMOGLOBIN A1C
HEMOGLOBIN A1C: 5.8 % — AB (ref 4.8–5.6)
MEAN PLASMA GLUCOSE: 120 mg/dL

## 2016-06-10 LAB — GLUCOSE, CAPILLARY: GLUCOSE-CAPILLARY: 88 mg/dL (ref 65–99)

## 2016-06-10 LAB — HIV ANTIBODY (ROUTINE TESTING W REFLEX): HIV SCREEN 4TH GENERATION: NONREACTIVE

## 2016-06-10 MED ORDER — ATORVASTATIN CALCIUM 40 MG PO TABS: 40.0000 mg | ORAL_TABLET | Freq: Every day | ORAL | 0 refills | Status: AC

## 2016-06-10 MED ORDER — NICOTINE 21 MG/24HR TD PT24: 21.0000 mg | MEDICATED_PATCH | Freq: Every day | TRANSDERMAL | 0 refills | Status: AC

## 2016-06-10 MED ORDER — ASPIRIN 81 MG PO TBEC: 81.0000 mg | DELAYED_RELEASE_TABLET | Freq: Every day | ORAL | 0 refills | Status: AC

## 2016-06-10 MED ORDER — ASPIRIN EC 81 MG PO TBEC: 81.0000 mg | DELAYED_RELEASE_TABLET | Freq: Every day | ORAL | Status: DC

## 2016-06-10 NOTE — Progress Notes (Signed)
Pt d/ced home.  MRI showed possible microvascular insult or could be artifacts.  Pt continues to have some weakness in RUE and RLE.  Will be getting home health PT and has been advised to use a walker.  Dr provided a work note, and she will be on 81 mg aspirin and plavix. She has been strongly encouraged to stop smoking and she has a script for a patch.  IV removed and d/c instructions reviewed.  Patient will be transported home by her son.

## 2016-06-10 NOTE — Care Management (Signed)
Physical therapy evaluation completed. Recommending outpatient services. Referral signed and faxed down to rehab department. Gwenette Greet RN MSN CCM Care Management [phone]

## 2016-06-10 NOTE — Progress Notes (Signed)
*  PRELIMINARY RESULTS* Echocardiogram 2D Echocardiogram has been performed.  Cristela Blue 06/10/2016, 2:37 PM

## 2016-06-10 NOTE — Progress Notes (Signed)
Physical Therapy Treatment Patient Details Name: Nicole Moses MRN: [id number] DOB: [birth year] Today's Date: 06/10/2016    History of Present Illness 61 yo Female came to ED after syncope episode with right sided weakness. CT scan was negative for abnormality; She is currently on telemetry and is waiting on MRI and additional tests to determine cause of syncope episode. Patient reports being independent prior to admittance;     PT Comments    Returned to room and ready for session.  Pt with no assist for bed mobility or sitting balance.  Initially attempted [facility] but she was unsteady in standing.  Trial of RW and pt reported increased comfort and confidence with walker.  She was then able to continue to ambulate x 2 around nursing unit with a good steady pace and no LOB or buckling.  She did report fatigue in RLE and sensory changes - "throbbing", "pulsing".  Fatigued with gait but requesting to stay up in recliner at end of session.   Follow Up Recommendations  Outpatient PT     Equipment Recommendations  Rolling walker with 5" wheels    Recommendations for Other Services       Precautions / Restrictions Precautions Precautions: Fall Restrictions Weight Bearing Restrictions: No    Mobility  Bed Mobility Overal bed mobility: Modified Independent                Transfers Overall transfer level: Needs assistance   Transfers: Sit to/from Stand Sit to Stand: Min guard         General transfer comment: initially unsteady in standing with SPC but trialed walker and pt felt much steadier.  Ambulation/Gait Ambulation/Gait assistance: Min guard Ambulation Distance (Feet): 320 Feet Assistive device: Rolling walker (2 wheeled) Gait Pattern/deviations: Step-through pattern     General Gait Details: generally steady with walker.  reports RLE fatigue and sensation changes   Stairs            Wheelchair Mobility    Modified Rankin (Stroke Patients Only)        Balance Overall balance assessment: Needs assistance Sitting-balance support: Feet supported Sitting balance-Leahy Scale: Good     Standing balance support: Bilateral upper extremity supported Standing balance-Leahy Scale: Fair                              Cognition Arousal/Alertness: Awake/alert Behavior During Therapy: WFL for tasks assessed/performed Overall Cognitive Status: Within Functional Limits for tasks assessed                                        Exercises      General Comments        Pertinent Vitals/Pain Pain Assessment: No/denies pain    Home Living                      Prior Function            PT Goals (current goals can now be found in the care plan section) Progress towards PT goals: Progressing toward goals    Frequency    7X/week      PT Plan Current plan remains appropriate    Co-evaluation              AM-PAC PT "6 Clicks" Daily Activity  Outcome Measure  Difficulty turning over in bed (including  adjusting bedclothes, sheets and blankets)?: None Difficulty moving from lying on back to sitting on the side of the bed? : None Difficulty sitting down on and standing up from a chair with arms (e.g., wheelchair, bedside commode, etc,.)?: A Little Help needed moving to and from a bed to chair (including a wheelchair)?: A Little Help needed walking in hospital room?: A Little Help needed climbing 3-5 steps with a railing? : A Little 6 Click Score: 20    End of Session Equipment Utilized During Treatment: Gait belt Activity Tolerance: Patient tolerated treatment well;No increased pain Patient left: in chair;with chair alarm set;with call bell/phone within reach         Time: 1110-1124 PT Time Calculation (min) (ACUTE ONLY): 14 min  Charges:  $Gait Training: 8-22 mins                    G Codes:       Danielle Dess, PTA 06/10/16, 11:51 AM

## 2016-06-10 NOTE — Progress Notes (Signed)
Patient ID: Nicole Moses, female   DOB: [birth year], 61 y.o.   MRN: [id number] [facility] [place] [name] was admitted to the Hospital on 06/09/2016 and Discharged  06/10/2016 and should be excused from work/school   for 14 days starting 06/09/2016 , may return to work/school without any restrictions.  Alford Highland M.D on 06/10/2016,at 3:13 PM  [facility] [name] [facility]  [phone]

## 2016-06-10 NOTE — Progress Notes (Signed)
Subjective: No new neurological complaints.  Unchanged from yesterday.  Performance inconsistent with therapy.    Objective: Current vital signs: BP 119/65   Pulse 78   Temp 98.7 F (37.1 C) (Oral)   Resp 16   Ht  (1.499 m)   Wt 66.1 kg (145 lb 12.8 oz)   SpO2 99%   BMI 29.45 kg/m  Vital signs in last 24 hours: Temp:  [98 F (36.7 C)-98.7 F (37.1 C)] 98.7 F (37.1 C) (05/01 1353) Pulse Rate:  [72-101] 78 (05/01 1353) Resp:  [14-18] 16 (05/01 0441) BP: (112-144)/(49-71) 119/65 (05/01 1353) SpO2:  [94 %-100 %] 99 % (05/01 1353) FiO2 (%):  [21 %] 21 % (04/30 1459) Weight:  [66.1 kg (145 lb 12.8 oz)] 66.1 kg (145 lb 12.8 oz) (04/30 1456)  Intake/Output from previous day: 04/30 0701 - 05/01 0700 In: 2812.1 [P.O.:360; I.V.:1452.1; IV Piggyback:1000] Out: -  Intake/Output this shift: Total I/O In: 360 [P.O.:360] Out: 350 [Urine:350] Nutritional status: DIET DYS 3 Room service appropriate? Yes; Fluid consistency: Thin  Neurologic Exam: Mental Status: Alert, oriented, thought content appropriate.  Speech fluent without evidence of aphasia.  Able to follow 3 step commands without difficulty. Cranial Nerves: II: Discs flat bilaterally; Visual fields grossly normal, pupils equal, round, reactive to light and accommodation III,IV, VI: ptosis not present, extra-ocular motions intact bilaterally V,VII: smile symmetric, facial light touch sensation decreased on the right VIII: hearing normal bilaterally IX,X: gag reflex present XI: bilateral shoulder shrug XII: midline tongue extension Motor: Full strength throughout.  Initially unable to grip with the right hand but when trying to walk patient held cords by gripping strongly with the right hand.  Mirror movements Sensory: Pinprick and light touch decreased on the right   Lab Results: Basic Metabolic Panel:  Recent Labs Lab 06/09/16 0915 06/10/16 0314  NA 139 139  K 4.3 4.2  CL 102 106  CO2 23 27  GLUCOSE 101*  91  BUN 8 9  CREATININE 0.84 0.72  CALCIUM 9.9 8.6*    Liver Function Tests:  Recent Labs Lab 06/09/16 0915  AST 35  ALT 22  ALKPHOS 59  BILITOT 0.9  PROT 8.7*  ALBUMIN 4.4   No results for input(s): LIPASE, AMYLASE in the last 168 hours. No results for input(s): AMMONIA in the last 168 hours.  CBC:  Recent Labs Lab 06/09/16 0915 06/10/16 0314  WBC 6.4 4.8  NEUTROABS 4.6  --   HGB 14.9 12.7  HCT 44.2 38.1  MCV 89.5 91.2  PLT 292 223    Cardiac Enzymes:  Recent Labs Lab 06/09/16 0915 06/09/16 1534 06/09/16 2038 06/10/16 0314  TROPONINI <0.03 <0.03 <0.03 <0.03    Lipid Panel:  Recent Labs Lab 06/10/16 0314  CHOL 225*  TRIG 103  HDL 78  CHOLHDL 2.9  VLDL 21  LDLCALC 045*    CBG:  Recent Labs Lab 06/10/16 0725  GLUCAP 88    Microbiology: No results found for this or any previous visit.  Coagulation Studies: No results for input(s): LABPROT, INR in the last 72 hours.  Imaging: Dg Chest 1 View  Result Date: 06/09/2016 CLINICAL DATA:  61 year old female with history of shakiness and dizziness. Vomiting. EXAM: CHEST 1 VIEW COMPARISON:  No priors. FINDINGS: Diffuse peribronchial cuffing, most evident throughout the mid to lower lungs bilaterally where there is also diffuse interstitial prominence. Lung volumes are slightly low. No consolidative airspace disease. No pleural effusions. No pneumothorax. No pulmonary nodule or mass noted. Pulmonary  vasculature and the cardiomediastinal silhouette are within normal limits. Atherosclerosis in the thoracic aorta. IMPRESSION: 1. The appearance the chest suggests an acute bronchitis. The possibility of underlying interstitial lung disease should also be considered given the low lung volumes and basal predominance of findings, but is not strongly favored at this time. Repeat standing PA and lateral chest radiograph is recommended in 2-3 weeks after treatment to ensure resolution of these findings.  Electronically Signed   By: Trudie Reed M.D.   On: 06/09/2016 09:56   Ct Head Wo Contrast  Result Date: 06/09/2016 CLINICAL DATA:  Sudden onset nausea, headache, and tremors this morning. EXAM: CT HEAD WITHOUT CONTRAST TECHNIQUE: Contiguous axial images were obtained from the base of the skull through the vertex without intravenous contrast. COMPARISON:  None. FINDINGS: Brain: The brainstem, cerebellum, cerebral peduncles, thalami, basal ganglia, basilar cisterns, and ventricular system appear within normal limits. No intracranial hemorrhage, mass lesion, or acute CVA. Vascular: Unremarkable Skull: Unremarkable Sinuses/Orbits: Unremarkable Other: No supplemental non-categorized findings. IMPRESSION: 1. No significant abnormality is observed to explain the patient's symptoms. Electronically Signed   By: Gaylyn Rong M.D.   On: 06/09/2016 09:46   Mr Brain Wo Contrast  Result Date: 06/10/2016 CLINICAL DATA:  Acute presentation with dizziness, right-sided weakness and numbness and blurred vision. Symptoms began suddenly on 06/09/2016 EXAM: MRI HEAD WITHOUT CONTRAST TECHNIQUE: Multiplanar, multiecho pulse sequences of the brain and surrounding structures were obtained without intravenous contrast. COMPARISON:  CT 06/09/2016 FINDINGS: Brain: There are 2 punctate, questionable foci of restricted diffusion in the hippocampus on the left. These could represent microvascular insults or could be artifactual. I do not think they are conclusively demonstrated on the coronal or the ADC maps. Elsewhere, there are chronic appearing small vessel ischemic changes of the cerebral hemispheric white matter. Right frontal white matter focus shows some T2 shine through without true restricted diffusion. No cortical or large vessel territory abnormality. No mass lesion, hemorrhage, hydrocephalus or extra-axial collection. Vascular: Major vessels at the base of the brain show flow. Skull and upper cervical spine: Negative  Sinuses/Orbits: Clear/ normal. Right maxillary sinus is hypoplastic. Other: None significant IMPRESSION: Question 2 punctate foci of restricted diffusion/acute infarction in the left hippocampus. These could represent microvascular infarctions. However, there are not conclusively seen on the coronal exam. If it is important to establish the veracity of this finding, thin section diffusion scanning could be repeated through this region. Elsewhere, there are chronic appearing small vessel ischemic changes of the cerebral hemispheric white matter, mild in degree. Electronically Signed   By: Paulina Fusi M.D.   On: 06/10/2016 09:52   US Carotid Bilateral  Result Date: 06/10/2016 CLINICAL DATA:  CVA. EXAM: BILATERAL CAROTID DUPLEX ULTRASOUND TECHNIQUE: Wallace Cullens scale imaging, color Doppler and duplex ultrasound were performed of bilateral carotid and vertebral arteries in the neck. COMPARISON:  MRI 06/10/2016.  CT 06/09/2016. FINDINGS: Criteria: Quantification of carotid stenosis is based on velocity parameters that correlate the residual internal carotid diameter with NASCET-based stenosis levels, using the diameter of the distal internal carotid lumen as the denominator for stenosis measurement. The following velocity measurements were obtained: RIGHT ICA:  64/19 cm/sec CCA:  76/17 cm/sec SYSTOLIC ICA/CCA RATIO:  0.9 DIASTOLIC ICA/CCA RATIO:  0.1 ECA:  69 cm/sec LEFT ICA:  87/22 cm/sec CCA:  69/19 cm/sec SYSTOLIC ICA/CCA RATIO:  1.3 DIASTOLIC ICA/CCA RATIO:  1.2 ECA:  66 cm/sec RIGHT CAROTID ARTERY: Mild intimal thickening. Minimal punctate calcific plaque right carotid bifurcation. RIGHT VERTEBRAL ARTERY:  Patent  with antegrade flow. LEFT CAROTID ARTERY: Mild intimal thickening. No significant atherosclerotic vascular disease. LEFT VERTEBRAL ARTERY:  Patent antegrade flow. IMPRESSION: 1. Minimal right carotid intimal thickening with mild punctate calcific plaque right carotid bifurcation. No flow limiting stenosis.  Degree of stenosis less than 50%. 2. Minimal right carotid intimal thickening. No significant atherosclerotic vascular disease. 3. Vertebrals are patent antegrade flow. Electronically Signed   By: Maisie Fus  Register   On: 06/10/2016 12:25    Medications:  I have reviewed the patient's current medications. Scheduled: . aspirin EC  325 mg Oral Daily  . atorvastatin  40 mg Oral q1800  . docusate sodium  100 mg Oral BID  . enoxaparin (LOVENOX) injection  40 mg Subcutaneous Q24H  . nicotine  14 mg Transdermal Daily  . sodium chloride flush  3 mL Intravenous Q12H    Assessment/Plan: Patient without new neurological complaints.  MRI of the brain reviewed.  There are 2 questionable areas of acute infarct in the left hippocampal region.  This does not necessarily explain all of the patient's complaints.  Patient started on ASA.  LDL  126.  Started on a statin.  A1c normal at 5.8.  Carotid dopplers show no evidence of hemodynamically significant stenosis.  Echocardiogram pending.  Recommendations: 1. Continue ASA   LOS: 1 day   Thana Farr, MD Neurology [phone] 06/10/2016  1:57 PM

## 2016-06-10 NOTE — Discharge Summary (Signed)
[facility] - [place] [facility]   PATIENT NAME: Nicole Moses    MR#:  [id number]  DATE OF BIRTH:  [birth year]  DATE OF ADMISSION:  06/09/2016 ADMITTING PHYSICIAN: Katharina Caper, MD  DATE OF DISCHARGE: 06/10/2016  PRIMARY CARE PHYSICIAN: [facility]   ADMISSION DIAGNOSIS:  Numbness [R20.0] Tremor [R25.1]  DISCHARGE DIAGNOSIS:  Active Problems:   Acute CVA (cerebrovascular accident) (HCC)   Expressive aphasia   Dizziness   Hyperglycemia   Tobacco abuse counseling   Stroke (cerebrum) (HCC)   SECONDARY DIAGNOSIS:   Past Medical History:  Diagnosis Date  . Smoking    1975 - 1 pack lasts 3/5 days    HOSPITAL COURSE:   1. 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 Since the patient is medication nave the patient was started on aspirin and Lipitor. Echocardiogram no source of stroke. Telemetry monitoring no arrhythmia. Carotid ultrasound showed some plaque. Urine analysis negative. Physical therapy recommended outpatient physical therapy. 2. Hyperlipidemia unspecified. LDL 126. Goal less than 70. Lipitor started 3. Tobacco abuse smoking is his agent counseling done 4 minutes by me. Nicotine patch applied and prescribed upon going home.   DISCHARGE CONDITIONS:   satisfactory  CONSULTS OBTAINED:  Treatment Team:  Kym Groom, MD Thana Farr, MD  DRUG ALLERGIES:   Allergies  Allergen Reactions  . Codeine Hives and Swelling    Tongue swelling  . Penicillins Hives and Swelling    Has patient had a PCN reaction causing immediate rash, facial/tongue/throat swelling, SOB or lightheadedness with hypotension: Yes Has patient had a PCN reaction causing severe rash involving mucus membranes or skin necrosis: No Has patient had a PCN reaction that required hospitalization No Has patient had a PCN reaction occurring within the last 10 years: Yes If all of the above answers are "NO", then may proceed with  Cephalosporin use.     DISCHARGE MEDICATIONS:   Current Discharge Medication List    START taking these medications   Details  aspirin EC 81 MG EC tablet Take 1 tablet (81 mg total) by mouth daily. Qty: 30 tablet, Refills: 0    atorvastatin (LIPITOR) 40 MG tablet Take 1 tablet (40 mg total) by mouth daily at 6 PM. Qty: 30 tablet, Refills: 0    nicotine (NICODERM CQ - DOSED IN MG/24 HOURS) 21 mg/24hr patch Place 1 patch (21 mg total) onto the skin daily. Qty: 30 patch, Refills: 0         DISCHARGE INSTRUCTIONS:   [facility]  If you experience worsening of your admission symptoms, develop shortness of breath, life threatening emergency, suicidal or homicidal thoughts you must seek medical attention immediately by calling 911 or calling your MD immediately  if symptoms less severe.  You Must read complete instructions/literature along with all the possible adverse reactions/side effects for all the Medicines you take and that have been prescribed to you. Take any new Medicines after you have completely understood and accept all the possible adverse reactions/side effects.   Please note  You were cared for by a hospitalist during your hospital stay. If you have any questions about your discharge medications or the care you received while you were in the hospital after you are discharged, you can call the unit and asked to speak with the hospitalist on call if the hospitalist that took care of you is not available. Once you are discharged, your primary care physician will handle any further medical issues.  Please note that NO REFILLS for any discharge medications will be authorized once you are discharged, as it is imperative that you return to your primary care physician (or establish a relationship with a primary care physician if you do not have one) for your aftercare needs so that they can reassess your need for medications and monitor your lab  values.    Today   CHIEF COMPLAINT:   Chief Complaint  Patient presents with  . Dizziness  . Shaking  . Nausea    HISTORY OF PRESENT ILLNESS:  Nicole Moses  is a 61 y.o. female presented with dizziness shaking and nausea and found to have an acute stroke   VITAL SIGNS:  Blood pressure 119/65, pulse 78, temperature 98.7 F (37.1 C), temperature source Oral, resp. rate 16, height  (1.499 m), weight 66.1 kg (145 lb 12.8 oz), SpO2 99 %.    PHYSICAL EXAMINATION:  GENERAL:  61 y.o.-year-old patient lying in the bed with no acute distress.  EYES: Pupils equal, round, reactive to light and accommodation. No scleral icterus. Extraocular muscles intact.  HEENT: Head atraumatic, normocephalic. Oropharynx and nasopharynx clear.  NECK:  Supple, no jugular venous distention. No thyroid enlargement, no tenderness.  LUNGS: Normal breath sounds bilaterally, no wheezing, rales,rhonchi or crepitation. No use of accessory muscles of respiration.  CARDIOVASCULAR: S1, S2 normal. No murmurs, rubs, or gallops.  ABDOMEN: Soft, non-tender, non-distended. Bowel sounds present. No organomegaly or mass.  EXTREMITIES: No pedal edema, cyanosis, or clubbing.  NEUROLOGIC: Cranial nerves II through XII are intact. Right-sided weakness 4 out of 5 right side. Sensation intact. Gait not checked.  PSYCHIATRIC: The patient is alert and oriented x 3.  SKIN: No obvious rash, lesion, or ulcer.   DATA REVIEW:   CBC  Recent Labs Lab 06/10/16 0314  WBC 4.8  HGB 12.7  HCT 38.1  PLT 223    Chemistries   Recent Labs Lab 06/09/16 0915 06/10/16 0314  NA 139 139  K 4.3 4.2  CL 102 106  CO2 23 27  GLUCOSE 101* 91  BUN 8 9  CREATININE 0.84 0.72  CALCIUM 9.9 8.6*  AST 35  --   ALT 22  --   ALKPHOS 59  --   BILITOT 0.9  --     Cardiac Enzymes  Recent Labs Lab 06/10/16 0314  TROPONINI <0.03    Microbiology Results  No results found for this or any previous visit.  RADIOLOGY:  Dg  Chest 1 View  Result Date: 06/09/2016 CLINICAL DATA:  61 year old female with history of shakiness and dizziness. Vomiting. EXAM: CHEST 1 VIEW COMPARISON:  No priors. FINDINGS: Diffuse peribronchial cuffing, most evident throughout the mid to lower lungs bilaterally where there is also diffuse interstitial prominence. Lung volumes are slightly low. No consolidative airspace disease. No pleural effusions. No pneumothorax. No pulmonary nodule or mass noted. Pulmonary vasculature and the cardiomediastinal silhouette are within normal limits. Atherosclerosis in the thoracic aorta. IMPRESSION: 1. The appearance the chest suggests an acute bronchitis. The possibility of underlying interstitial lung disease should also be considered given the low lung volumes and basal predominance of findings, but is not strongly favored at this time. Repeat standing PA and lateral chest radiograph is recommended in 2-3 weeks after treatment to ensure resolution of these findings. Electronically Signed   By: Trudie Reed M.D.   On: 06/09/2016 09:56   Ct Head Wo Contrast  Result Date: 06/09/2016 CLINICAL DATA:  Sudden onset nausea, headache, and tremors this  morning. EXAM: CT HEAD WITHOUT CONTRAST TECHNIQUE: Contiguous axial images were obtained from the base of the skull through the vertex without intravenous contrast. COMPARISON:  None. FINDINGS: Brain: The brainstem, cerebellum, cerebral peduncles, thalami, basal ganglia, basilar cisterns, and ventricular system appear within normal limits. No intracranial hemorrhage, mass lesion, or acute CVA. Vascular: Unremarkable Skull: Unremarkable Sinuses/Orbits: Unremarkable Other: No supplemental non-categorized findings. IMPRESSION: 1. No significant abnormality is observed to explain the patient's symptoms. Electronically Signed   By: Gaylyn Rong M.D.   On: 06/09/2016 09:46   Mr Brain Wo Contrast  Result Date: 06/10/2016 CLINICAL DATA:  Acute presentation with dizziness,  right-sided weakness and numbness and blurred vision. Symptoms began suddenly on 06/09/2016 EXAM: MRI HEAD WITHOUT CONTRAST TECHNIQUE: Multiplanar, multiecho pulse sequences of the brain and surrounding structures were obtained without intravenous contrast. COMPARISON:  CT 06/09/2016 FINDINGS: Brain: There are 2 punctate, questionable foci of restricted diffusion in the hippocampus on the left. These could represent microvascular insults or could be artifactual. I do not think they are conclusively demonstrated on the coronal or the ADC maps. Elsewhere, there are chronic appearing small vessel ischemic changes of the cerebral hemispheric white matter. Right frontal white matter focus shows some T2 shine through without true restricted diffusion. No cortical or large vessel territory abnormality. No mass lesion, hemorrhage, hydrocephalus or extra-axial collection. Vascular: Major vessels at the base of the brain show flow. Skull and upper cervical spine: Negative Sinuses/Orbits: Clear/ normal. Right maxillary sinus is hypoplastic. Other: None significant IMPRESSION: Question 2 punctate foci of restricted diffusion/acute infarction in the left hippocampus. These could represent microvascular infarctions. However, there are not conclusively seen on the coronal exam. If it is important to establish the veracity of this finding, thin section diffusion scanning could be repeated through this region. Elsewhere, there are chronic appearing small vessel ischemic changes of the cerebral hemispheric white matter, mild in degree. Electronically Signed   By: Paulina Fusi M.D.   On: 06/10/2016 09:52   US Carotid Bilateral  Result Date: 06/10/2016 CLINICAL DATA:  CVA. EXAM: BILATERAL CAROTID DUPLEX ULTRASOUND TECHNIQUE: Wallace Cullens scale imaging, color Doppler and duplex ultrasound were performed of bilateral carotid and vertebral arteries in the neck. COMPARISON:  MRI 06/10/2016.  CT 06/09/2016. FINDINGS: Criteria: Quantification of  carotid stenosis is based on velocity parameters that correlate the residual internal carotid diameter with NASCET-based stenosis levels, using the diameter of the distal internal carotid lumen as the denominator for stenosis measurement. The following velocity measurements were obtained: RIGHT ICA:  64/19 cm/sec CCA:  76/17 cm/sec SYSTOLIC ICA/CCA RATIO:  0.9 DIASTOLIC ICA/CCA RATIO:  0.1 ECA:  69 cm/sec LEFT ICA:  87/22 cm/sec CCA:  69/19 cm/sec SYSTOLIC ICA/CCA RATIO:  1.3 DIASTOLIC ICA/CCA RATIO:  1.2 ECA:  66 cm/sec RIGHT CAROTID ARTERY: Mild intimal thickening. Minimal punctate calcific plaque right carotid bifurcation. RIGHT VERTEBRAL ARTERY:  Patent with antegrade flow. LEFT CAROTID ARTERY: Mild intimal thickening. No significant atherosclerotic vascular disease. LEFT VERTEBRAL ARTERY:  Patent antegrade flow. IMPRESSION: 1. Minimal right carotid intimal thickening with mild punctate calcific plaque right carotid bifurcation. No flow limiting stenosis. Degree of stenosis less than 50%. 2. Minimal right carotid intimal thickening. No significant atherosclerotic vascular disease. 3. Vertebrals are patent antegrade flow. Electronically Signed   By: Maisie Fus  Register   On: 06/10/2016 12:25     Management plans discussed with the patient, and she is in agreement.  CODE STATUS:     Code Status Orders  Start     Ordered   06/09/16 1459  Full code  Continuous     06/09/16 1458    Code Status History    Date Active Date Inactive Code Status Order ID Comments User Context   This patient has a current code status but no historical code status.      TOTAL TIME TAKING CARE OF THIS PATIENT: 35 minutes.    Alford Highland M.D on 06/10/2016 at 3:19 PM  Between 7am to 6pm - Pager - [phone]  After 6pm go to [email or web address] - Social research officer, government  [facility] Office  [phone]  CC: Primary care physician; [facility]

## 2016-06-10 NOTE — Care Management Note (Addendum)
Case Management Note  Patient Details  Name: Nicole Moses MRN: [id number] Date of Birth: [birth year]  Subjective/Objective: Admitted to this facility with the diagnosis of CVA. Lives with husband, Fran Lowes [phone]). Son is Cornick [phone]). Just moved back from [facility] 2017. No primary care physician.  Works at [employer]. Just started the enrollment process for insurance at [employer].   Takes care of all basic activities of daily living herself. Larey Seat prior to this admission. No equipment in the home. Prescriptions are filled at [facility]              Action/Plan: Gave information on physicians accepting new patients.   Expected Discharge Date:  05/12/16               Expected Discharge Plan:     In-House Referral:     Discharge planning Services     Post Acute Care Choice:    Choice offered to:     DME Arranged:    DME Agency:     HH Arranged:    HH Agency:     Status of Service:    out patient services  If discussed at [employer], dates discussed:    Additional Comments:  Gwenette Greet, RN [facility] Care Management [phone] 06/10/2016, 8:02 AM

## 2016-06-10 NOTE — Evaluation (Signed)
Occupational Therapy Evaluation Patient Details Name: Nicole Moses MRN: [id number] DOB: 04-03-1955 Today's Date: 06/10/2016    History of Present Illness 61 yo Female came to ED after syncope episode with right sided weakness. CT scan was negative for abnormality; She is currently on telemetry and is waiting on MRI and additional tests to determine cause of syncope episode. Patient reports being independent prior to admittance;    Clinical Impression   Patient seen for OT evaluation this date.  Patient presents with RUE muscle weakness, lack of coordination, decreased range of motion, difficulty with completing self care tasks, decreased functional mobility and transfers.  Patient would benefit from skilled OT to maximize safety and independence in daily tasks to return home with family.      Follow Up Recommendations  Home health OT    Equipment Recommendations       Recommendations for Other Services       Precautions / Restrictions Precautions Precautions: Fall Restrictions Weight Bearing Restrictions: No      Mobility Bed Mobility                  Transfers Overall transfer level: Needs assistance Equipment used: Rolling walker (2 wheeled) Transfers: Sit to/from Stand Sit to Stand: Min guard              Balance Overall balance assessment: Needs assistance Sitting-balance support: Feet supported Sitting balance-Leahy Scale: Good Sitting balance - Comments: dynamic balance is poor with posterior lean during LE movement;  Postural control: Posterior lean Standing balance support: Bilateral upper extremity supported Standing balance-Leahy Scale: Fair Standing balance comment: requires CGA to min A for balance without AD;                            ADL either performed or assessed with clinical judgement   ADL Overall ADL's : Needs assistance/impaired Eating/Feeding: Modified independent   Grooming: Set up   Upper Body Bathing: Set  up;Sitting   Lower Body Bathing: Minimal assistance   Upper Body Dressing : Set up;Minimal assistance   Lower Body Dressing: Set up;Minimal assistance Lower Body Dressing Details (indicate cue type and reason): increased time and occasional assistance with donning right sock Toilet Transfer: Min guard   Toileting- Clothing Manipulation and Hygiene: Min guard         General ADL Comments: 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       Vision   Additional Comments: Patient reports initial issues with vision but feels they have resolved over the last day, no complaints of double vision or bluriness.      Perception     Praxis      Pertinent Vitals/Pain Pain Assessment: No/denies pain     Hand Dominance Right   Extremity/Trunk Assessment Upper Extremity Assessment Upper Extremity Assessment: RUE deficits/detail RUE Deficits / Details: RUE slow to move, shoulder flexion to 90 degrees of motion, partial fisting, full opposition of thumb to all digits, rapid alternating movements impaired, finger to nose slow to complete.  Patient complains of shakiness on the right.  RUE Coordination: decreased fine motor;decreased gross motor   Lower Extremity Assessment Lower Extremity Assessment: Defer to PT evaluation   Cervical / Trunk Assessment Cervical / Trunk Assessment: Normal   Communication Communication Communication: No difficulties   Cognition Arousal/Alertness: Awake/alert Behavior During Therapy: [facility]  for tasks assessed/performed Overall Cognitive Status: Within Functional Limits for tasks assessed                                     General Comments       Exercises     Shoulder Instructions      Home Living Family/patient expects to be discharged to:: Private residence Living Arrangements: Spouse/significant other Available Help at Discharge:  Family Type of Home: House Home Access: Ramped entrance     Home Layout: One level     Bathroom Shower/Tub: Chief Strategy Officer: Handicapped height Bathroom Accessibility: No   Home Equipment: Grab bars - toilet;Grab bars - tub/shower          Prior Functioning/Environment Level of Independence: Independent        Comments: did not use any assistive device; 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 She reports walking 1.5 miles per day        OT Problem List: Decreased strength;Impaired balance (sitting and/or standing);Decreased range of motion;Impaired vision/perception;Decreased activity tolerance;Decreased coordination;Decreased knowledge of use of DME or AE;Impaired UE functional use      OT Treatment/Interventions: Self-care/ADL training;DME and/or AE instruction;Therapeutic activities;Therapeutic exercise;Balance training;Neuromuscular education;Patient/family education    OT Goals(Current goals can be found in the care plan section) Acute Rehab OT Goals Patient Stated Goal: "I want to go home and get better" "Do what I did before" OT Goal Formulation: With patient Time For Goal Achievement: 06/21/16 Potential to Achieve Goals: Good  OT Frequency: Min 1X/week   Barriers to D/C:            Co-evaluation              AM-PAC PT "6 Clicks" Daily Activity     Outcome Measure Help from another person eating meals?: None Help from another person taking care of personal grooming?: A Little Help from another person toileting, which includes using toliet, bedpan, or urinal?: A Little Help from another person bathing (including washing, rinsing, drying)?: A Little Help from another person to put on and taking off regular upper body clothing?: A Little Help from another person to put on and taking off regular lower body clothing?: A Little 6  Click Score: 19   End of Session Equipment Utilized During Treatment: Gait belt;Rolling walker  Activity Tolerance: Patient tolerated treatment well Patient left: in chair;with call bell/phone within reach;with chair alarm set  OT Visit Diagnosis: Muscle weakness (generalized) (M62.81)                Time: 1610-9604 OT Time Calculation (min): 20 min Charges:  OT General Charges $OT Visit: 1 Procedure OT Evaluation $OT Eval Low Complexity: 1 Procedure OT Treatments $Self Care/Home Management : 8-22 mins G-Codes:     Kolsen Choe T Baraa Tubbs, OTR/L, CLT   Makayia Duplessis 06/10/2016, 4:26 PM

## 2016-06-10 NOTE — Progress Notes (Signed)
SLP Cancellation Note  Patient Details Name: Nicole Moses MRN: [id number] DOB: [birth year]   Cancelled treatment:       Reason Eval/Treat Not Completed: SLP screened, no needs identified, will sign off (chart reviewed; consulted pt and NSG re: status)  Pt denied any difficulty swallowing and is currently on a regular diet w/ meats cut d/t UE weakness; tolerates swallowing pills w/ water per NSG. Pt conversed at conversational level w/out deficits noted; pt denied any speech-language deficits.  No further skilled ST services indicated as pt appears at her baseline. Pt agreed. NSG to reconsult if any change in status.    Jerilynn Som, MS, CCC-SLP Miraj Truss 06/10/2016, 3:44 PM

## 2016-06-10 NOTE — Progress Notes (Signed)
OT Cancellation Note  Patient Details Name: Nicole Moses MRN: [id number] DOB: August 09, 1955   Cancelled Treatment:     Attempted to see patient this am for OT evaluation, patient is currently off the floor for testing, will continue attempts.  Quatisha Zylka T Masiyah Jorstad, OTR/L, CLT   Byanca Kasper 06/10/2016, 10:03 AM

## 2016-06-11 LAB — URINE CULTURE

## 2018-01-04 ENCOUNTER — Ambulatory Visit: Payer: Self-pay | Attending: Oncology

## 2018-08-24 IMAGING — MR MR HEAD W/O CM
10 series · 48 of 48 positions shown · non-contrast
Comparison: CT 06/09/2016

CLINICAL DATA: Acute presentation with dizziness, right-sided
weakness and numbness and blurred vision. Symptoms began suddenly on
06/09/2016

EXAM:
MRI HEAD WITHOUT CONTRAST
TECHNIQUE: Multiplanar, multiecho pulse sequences of the brain and surrounding
structures were obtained without intravenous contrast.

[Series 2: T1 · sagittal · 5.0mm · 0.45mm/px · 3 of 25 slices shown (1 of 2)]
[im 1/25]
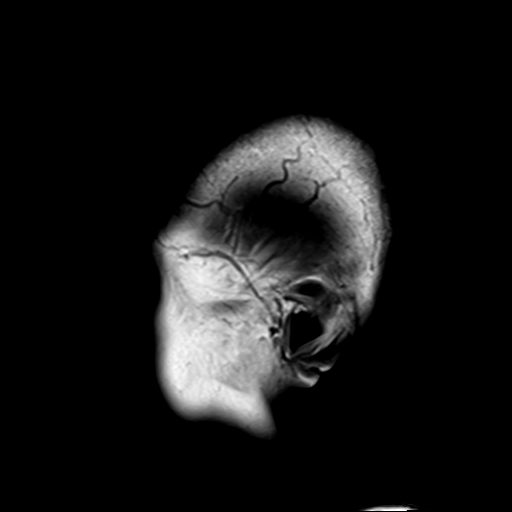
[im 13/25]
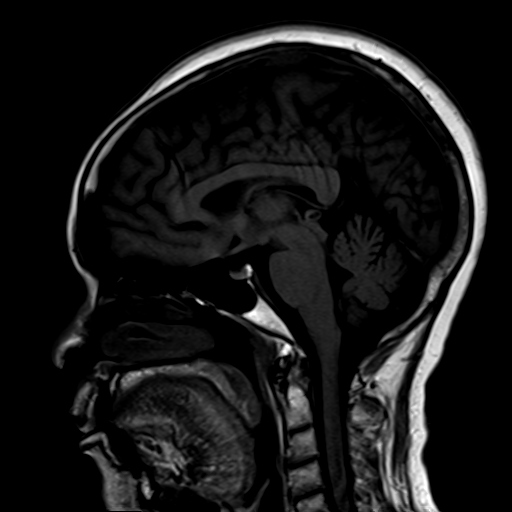
[im 25/25]
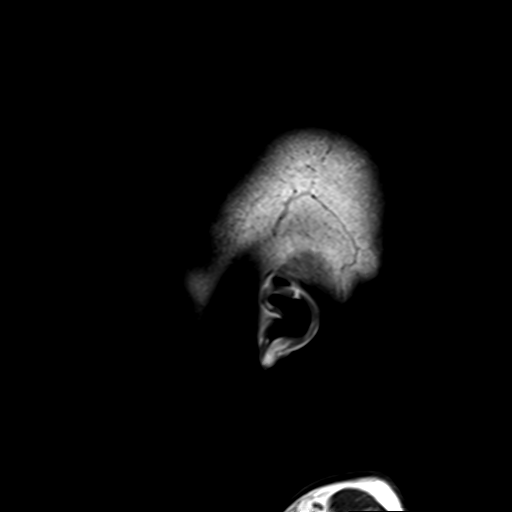

[Series 4: DWI · axial · 3.0mm · 1.80mm/px · z∈[-96,+63]mm · 5 of 53 slices shown (1 of 4)]
[im 1/53]
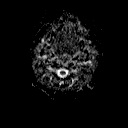
[im 14/53]
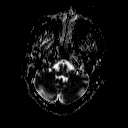
[im 27/53]
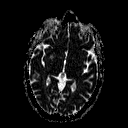
[im 40/53]
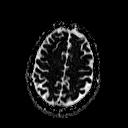
[im 53/53]
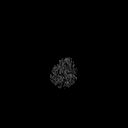

[Series 6: DWI · coronal · 3.0mm · 1.80mm/px · 4 of 45 slices shown (2 of 4)]
[im 1/45]
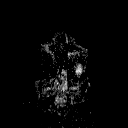
[im 15/45]
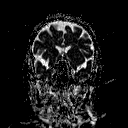
[im 30/45]
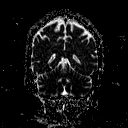
[im 45/45]
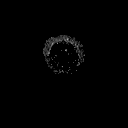

[Series 7: T2 · axial · 5.0mm · 0.60mm/px · z∈[-86,+69]mm · 2 of 25 slices shown (1 of 3)]
[im 1/25]
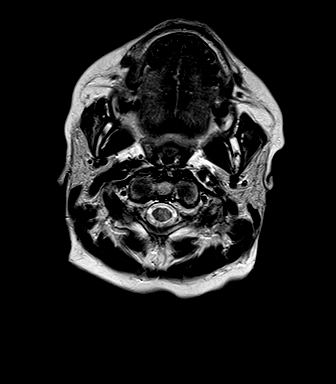
[im 25/25]
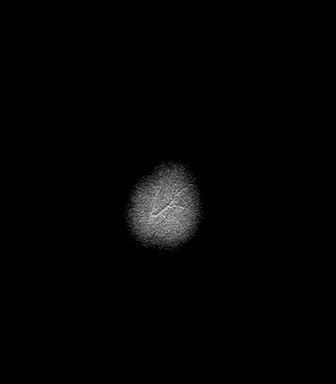

[Series 8: FLAIR · axial · 3.0mm · 0.45mm/px · z∈[-86,+69]mm · 5 of 53 slices shown]
[im 1/53]
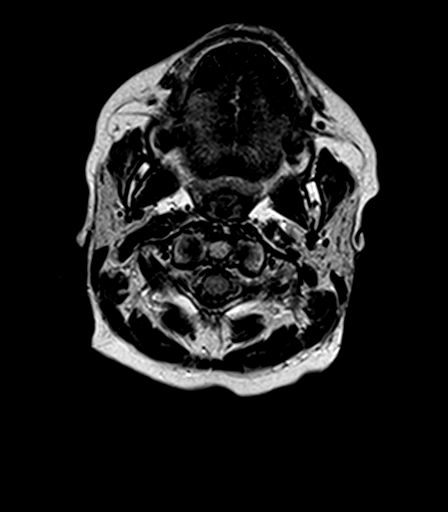
[im 14/53]
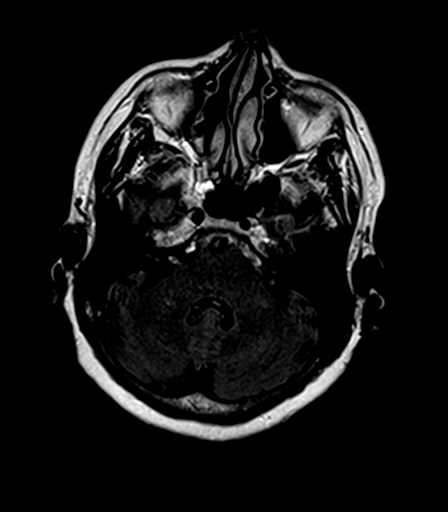
[im 27/53]
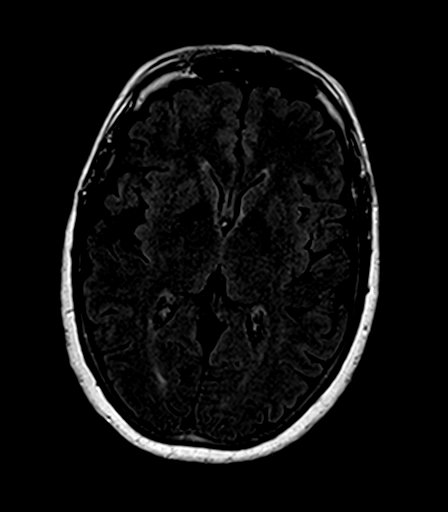
[im 40/53]
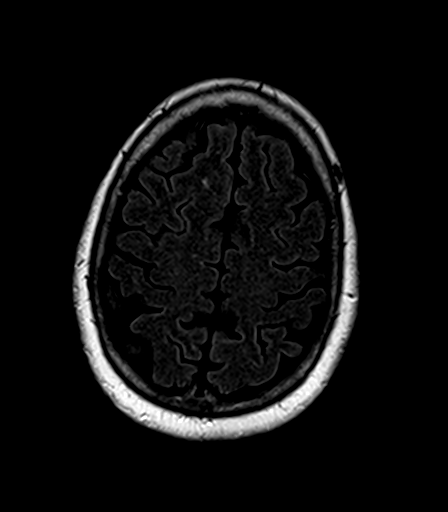
[im 53/53]
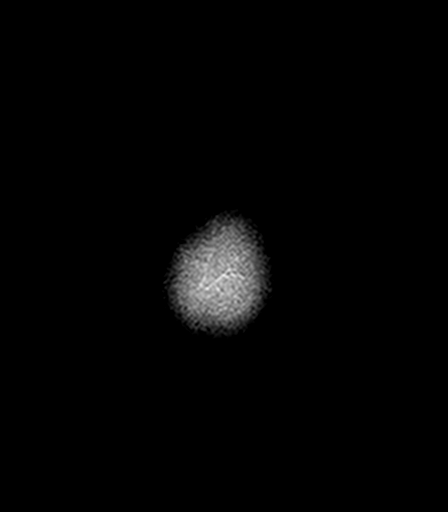

[Series 9: T2 · axial · 5.0mm · 0.45mm/px · z∈[-86,+69]mm · 2 of 25 slices shown (2 of 3)]
[im 1/25]
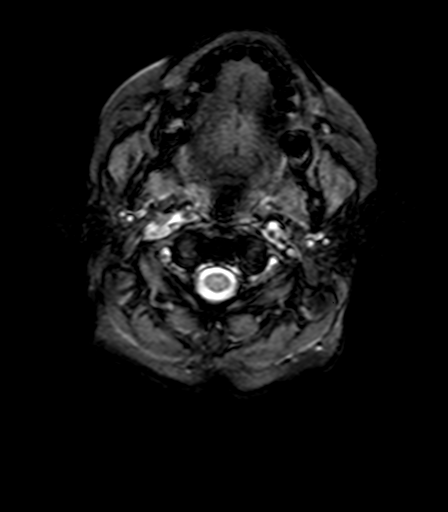
[im 25/25]
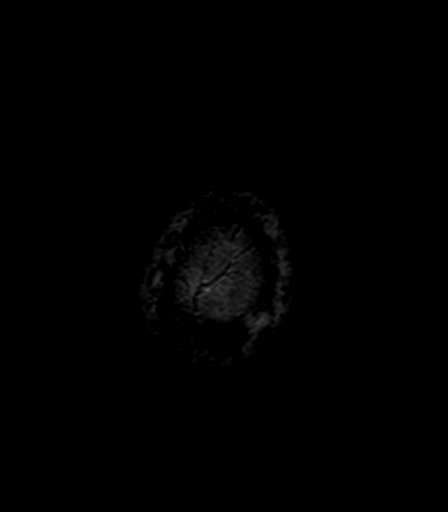

[Series 10: T1 · axial · 1.0mm · 1.00mm/px · z∈[-97,+76]mm · 16 of 176 slices shown (2 of 2)]
[im 1/176]
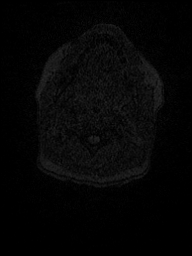
[im 12/176]
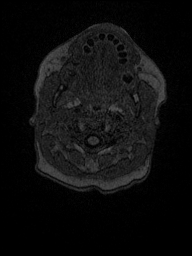
[im 24/176]
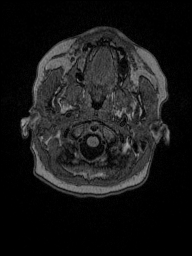
[im 36/176]
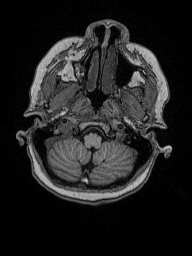
[im 47/176]
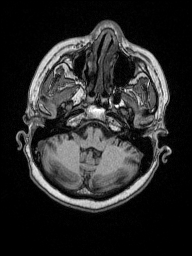
[im 59/176]
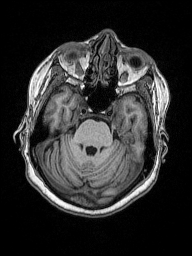
[im 71/176]
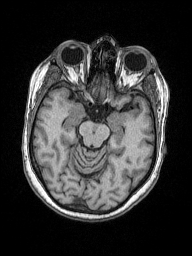
[im 82/176]
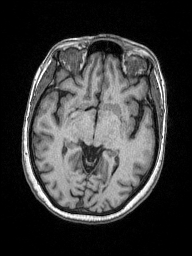
[im 94/176]
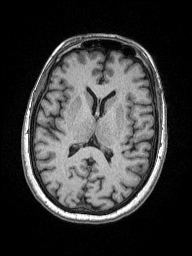
[im 106/176]
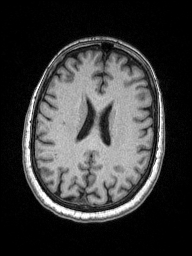
[im 117/176]
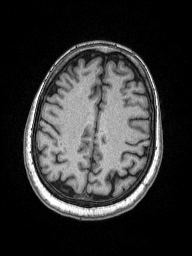
[im 129/176]
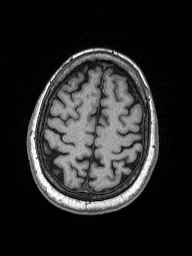
[im 141/176]
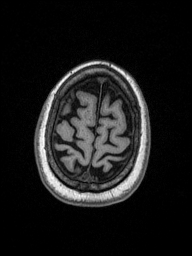
[im 152/176]
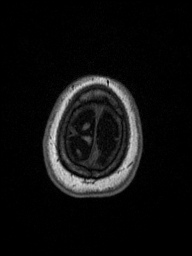
[im 164/176]
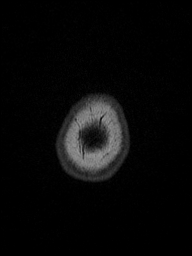
[im 176/176]
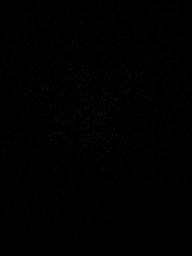

[Series 11: T2 · coronal · 5.0mm · 0.49mm/px · 2 of 27 slices shown (3 of 3)]
[im 1/27]
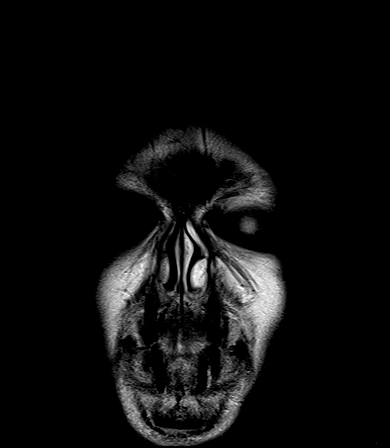
[im 27/27]
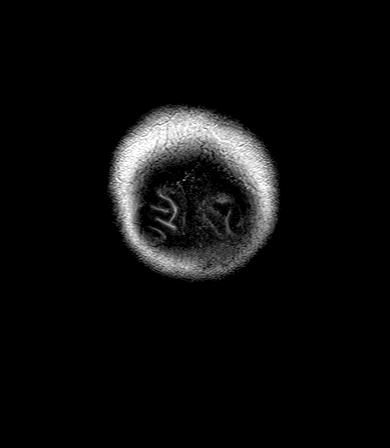

[Series 100: DWI · axial · 3.0mm · 1.80mm/px · z∈[-96,+63]mm · 5 of 55 slices shown (3 of 4)]
[im 1/55]
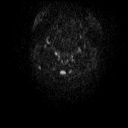
[im 14/55]
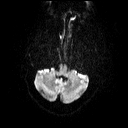
[im 28/55]
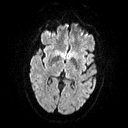
[im 41/55]
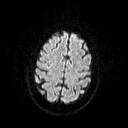
[im 55/55]
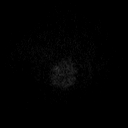

[Series 101: DWI · coronal · 3.0mm · 1.80mm/px · 4 of 45 slices shown (4 of 4)]
[im 1/45]
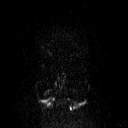
[im 15/45]
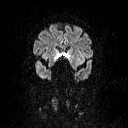
[im 30/45]
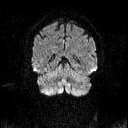
[im 45/45]
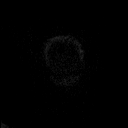

[48 of 48 positions shown; findings below may reference images not displayed]

FINDINGS: Brain: There are 2 punctate, questionable foci of restricted
diffusion in the hippocampus on the left. These could represent
microvascular insults or could be artifactual. I do not think they
are conclusively demonstrated on the coronal or the ADC maps.
Elsewhere, there are chronic appearing small vessel ischemic changes
of the cerebral hemispheric white matter. Right frontal white matter
focus shows some T2 shine through without true restricted diffusion.
No cortical or large vessel territory abnormality. No mass lesion,
hemorrhage, hydrocephalus or extra-axial collection.

Vascular: Major vessels at the base of the brain show flow.

Skull and upper cervical spine: Negative

Sinuses/Orbits: Clear/ normal. Right maxillary sinus is hypoplastic.

Other: None significant
IMPRESSION: Question 2 punctate foci of restricted diffusion/acute infarction in
the left hippocampus. These could represent microvascular
infarctions. However, there are not conclusively seen on the coronal
exam. If it is important to establish the veracity of this finding,
thin section diffusion scanning could be repeated through this
region.

Elsewhere, there are chronic appearing small vessel ischemic changes
of the cerebral hemispheric white matter, mild in degree.

## 2019-12-05 ENCOUNTER — Ambulatory Visit: Admit: 2019-12-05 | Discharge: 2019-12-06

## 2019-12-05 ENCOUNTER — Encounter: Admit: 2019-12-05 | Discharge: 2019-12-06

## 2019-12-05 MED ORDER — AMOXICILLIN 875 MG-POTASSIUM CLAVULANATE 125 MG TABLET
ORAL_TABLET | Freq: Two times a day (BID) | ORAL | 0 refills | 5 days | Status: CP
Start: 2019-12-05 — End: 2019-12-10

## 2019-12-06 MED ORDER — INHALATIONAL SPACING DEVICE
0 refills | 0 days | Status: CP
Start: 2019-12-06 — End: ?

## 2019-12-06 MED ORDER — NICOTINE (POLACRILEX) 2 MG BUCCAL LOZENGE
BUCCAL | 0 refills | 5.00000 days | Status: CP | PRN
Start: 2019-12-06 — End: 2020-01-05

## 2019-12-06 MED ORDER — NICOTINE (POLACRILEX) 2 MG GUM
BUCCAL | 0 refills | 10 days | Status: CP | PRN
Start: 2019-12-06 — End: 2020-01-05

## 2019-12-06 MED ORDER — PREDNISONE 20 MG TABLET
ORAL_TABLET | Freq: Every day | ORAL | 0 refills | 5 days | Status: CP
Start: 2019-12-06 — End: 2019-12-11

## 2019-12-06 MED ORDER — GUAIFENESIN ER 600 MG TABLET, EXTENDED RELEASE 12 HR
ORAL_TABLET | Freq: Two times a day (BID) | ORAL | 0 refills | 15 days | Status: CP
Start: 2019-12-06 — End: ?

## 2019-12-06 MED ORDER — ALBUTEROL SULFATE HFA 90 MCG/ACTUATION AEROSOL INHALER
Freq: Four times a day (QID) | RESPIRATORY_TRACT | 0 refills | 0 days | Status: CP | PRN
Start: 2019-12-06 — End: 2020-12-05

## 2019-12-06 MED ORDER — BENZONATATE 100 MG CAPSULE
ORAL_CAPSULE | Freq: Four times a day (QID) | ORAL | 1 refills | 8 days | Status: CP | PRN
Start: 2019-12-06 — End: 2020-12-05

## 2019-12-27 DIAGNOSIS — Z720 Tobacco use: Principal | ICD-10-CM

## 2019-12-27 DIAGNOSIS — J449 Chronic obstructive pulmonary disease, unspecified: Principal | ICD-10-CM

## 2020-05-11 DIAGNOSIS — G609 Hereditary and idiopathic neuropathy, unspecified: Principal | ICD-10-CM

## 2020-05-31 ENCOUNTER — Encounter
Admit: 2020-05-31 | Payer: PRIVATE HEALTH INSURANCE | Attending: Student in an Organized Health Care Education/Training Program | Primary: Student in an Organized Health Care Education/Training Program

## 2020-06-07 ENCOUNTER — Encounter
Admit: 2020-06-07 | Discharge: 2020-06-08 | Payer: PRIVATE HEALTH INSURANCE | Attending: Student in an Organized Health Care Education/Training Program | Primary: Student in an Organized Health Care Education/Training Program

## 2020-06-07 DIAGNOSIS — R200 Anesthesia of skin: Principal | ICD-10-CM

## 2020-06-07 DIAGNOSIS — R292 Abnormal reflex: Principal | ICD-10-CM

## 2020-06-07 DIAGNOSIS — R202 Paresthesia of skin: Principal | ICD-10-CM

## 2020-06-07 DIAGNOSIS — E538 Deficiency of other specified B group vitamins: Principal | ICD-10-CM

## 2020-06-07 DIAGNOSIS — G609 Hereditary and idiopathic neuropathy, unspecified: Principal | ICD-10-CM

## 2020-06-07 MED ORDER — GABAPENTIN 300 MG CAPSULE
ORAL_CAPSULE | Freq: Every evening | ORAL | 11 refills | 30 days | Status: CP
Start: 2020-06-07 — End: 2021-06-07

## 2020-06-25 ENCOUNTER — Encounter: Admit: 2020-06-25 | Discharge: 2020-06-26 | Payer: PRIVATE HEALTH INSURANCE

## 2020-07-06 DIAGNOSIS — E538 Deficiency of other specified B group vitamins: Principal | ICD-10-CM

## 2020-07-06 DIAGNOSIS — G609 Hereditary and idiopathic neuropathy, unspecified: Principal | ICD-10-CM

## 2020-07-06 DIAGNOSIS — G320 Subacute combined degeneration of spinal cord in diseases classified elsewhere: Principal | ICD-10-CM

## 2020-07-06 DIAGNOSIS — R202 Paresthesia of skin: Principal | ICD-10-CM

## 2020-07-06 DIAGNOSIS — R200 Anesthesia of skin: Principal | ICD-10-CM

## 2020-07-06 MED ORDER — GABAPENTIN 300 MG CAPSULE
ORAL_CAPSULE | Freq: Three times a day (TID) | ORAL | 11 refills | 30 days | Status: CP
Start: 2020-07-06 — End: 2021-07-06

## 2020-07-17 ENCOUNTER — Encounter: Admit: 2020-07-17 | Discharge: 2020-07-18 | Payer: PRIVATE HEALTH INSURANCE

## 2020-07-17 DIAGNOSIS — G629 Polyneuropathy, unspecified: Principal | ICD-10-CM

## 2020-07-17 DIAGNOSIS — G609 Hereditary and idiopathic neuropathy, unspecified: Principal | ICD-10-CM

## 2020-07-17 DIAGNOSIS — R200 Anesthesia of skin: Principal | ICD-10-CM

## 2020-07-17 DIAGNOSIS — R202 Paresthesia of skin: Principal | ICD-10-CM

## 2020-07-17 DIAGNOSIS — E538 Deficiency of other specified B group vitamins: Principal | ICD-10-CM

## 2020-07-17 DIAGNOSIS — J449 Chronic obstructive pulmonary disease, unspecified: Principal | ICD-10-CM

## 2020-07-17 DIAGNOSIS — Z7689 Persons encountering health services in other specified circumstances: Principal | ICD-10-CM

## 2020-07-17 MED ORDER — GABAPENTIN 300 MG CAPSULE
ORAL_CAPSULE | Freq: Three times a day (TID) | ORAL | 11 refills | 30 days | Status: CP
Start: 2020-07-17 — End: 2021-07-17

## 2020-07-23 ENCOUNTER — Non-Acute Institutional Stay: Admit: 2020-07-23 | Discharge: 2020-07-24 | Payer: PRIVATE HEALTH INSURANCE

## 2020-10-17 ENCOUNTER — Ambulatory Visit: Admit: 2020-10-17 | Payer: PRIVATE HEALTH INSURANCE

## 2020-11-30 ENCOUNTER — Ambulatory Visit: Admit: 2020-11-30 | Discharge: 2020-12-01

## 2020-11-30 DIAGNOSIS — F5101 Primary insomnia: Principal | ICD-10-CM

## 2020-11-30 DIAGNOSIS — Z1211 Encounter for screening for malignant neoplasm of colon: Principal | ICD-10-CM

## 2020-11-30 DIAGNOSIS — G629 Polyneuropathy, unspecified: Principal | ICD-10-CM

## 2020-11-30 DIAGNOSIS — Z1231 Encounter for screening mammogram for malignant neoplasm of breast: Principal | ICD-10-CM

## 2020-11-30 DIAGNOSIS — J449 Chronic obstructive pulmonary disease, unspecified: Principal | ICD-10-CM

## 2020-11-30 DIAGNOSIS — R030 Elevated blood-pressure reading, without diagnosis of hypertension: Principal | ICD-10-CM

## 2020-11-30 MED ORDER — TRAZODONE 50 MG TABLET
ORAL_TABLET | Freq: Every evening | ORAL | 1 refills | 30 days | Status: CP
Start: 2020-11-30 — End: 2020-12-30

## 2020-12-23 DIAGNOSIS — F5101 Primary insomnia: Principal | ICD-10-CM

## 2020-12-28 ENCOUNTER — Ambulatory Visit: Admit: 2020-12-28 | Discharge: 2020-12-29

## 2020-12-28 DIAGNOSIS — R030 Elevated blood-pressure reading, without diagnosis of hypertension: Principal | ICD-10-CM

## 2020-12-28 DIAGNOSIS — F5101 Primary insomnia: Principal | ICD-10-CM

## 2020-12-28 DIAGNOSIS — Z0000 Encounter for general adult medical examination without abnormal findings: Principal | ICD-10-CM

## 2020-12-28 DIAGNOSIS — E785 Hyperlipidemia, unspecified: Principal | ICD-10-CM

## 2020-12-28 MED ORDER — ATORVASTATIN 10 MG TABLET
ORAL_TABLET | Freq: Every day | ORAL | 3 refills | 90.00000 days | Status: CP
Start: 2020-12-28 — End: 2021-12-28

## 2020-12-28 MED ORDER — TRAZODONE 50 MG TABLET
ORAL_TABLET | Freq: Every evening | ORAL | 3 refills | 30 days | Status: CP
Start: 2020-12-28 — End: 2021-01-27

## 2021-03-08 ENCOUNTER — Ambulatory Visit: Admit: 2021-03-08 | Discharge: 2021-03-09 | Payer: PRIVATE HEALTH INSURANCE

## 2021-03-19 DIAGNOSIS — R928 Other abnormal and inconclusive findings on diagnostic imaging of breast: Principal | ICD-10-CM

## 2021-04-03 ENCOUNTER — Ambulatory Visit: Admit: 2021-04-03 | Discharge: 2021-04-03 | Payer: PRIVATE HEALTH INSURANCE

## 2021-05-27 ENCOUNTER — Ambulatory Visit: Payer: PRIVATE HEALTH INSURANCE

## 2021-05-29 MED ORDER — SULFAMETHOXAZOLE 800 MG-TRIMETHOPRIM 160 MG TABLET
ORAL_TABLET | Freq: Two times a day (BID) | ORAL | 0 refills | 5 days | Status: CP
Start: 2021-05-29 — End: 2021-06-03

## 2021-06-05 DIAGNOSIS — R202 Paresthesia of skin: Principal | ICD-10-CM

## 2021-06-05 DIAGNOSIS — G609 Hereditary and idiopathic neuropathy, unspecified: Principal | ICD-10-CM

## 2021-06-05 DIAGNOSIS — R200 Anesthesia of skin: Principal | ICD-10-CM

## 2021-06-27 ENCOUNTER — Ambulatory Visit: Admit: 2021-06-27 | Discharge: 2021-06-28 | Payer: PRIVATE HEALTH INSURANCE

## 2021-06-27 DIAGNOSIS — G629 Polyneuropathy, unspecified: Principal | ICD-10-CM

## 2021-06-27 DIAGNOSIS — I10 Essential (primary) hypertension: Principal | ICD-10-CM

## 2021-06-27 DIAGNOSIS — E785 Hyperlipidemia, unspecified: Principal | ICD-10-CM

## 2021-06-27 MED ORDER — CELECOXIB 200 MG CAPSULE
ORAL_CAPSULE | Freq: Every day | ORAL | 2 refills | 30 days | Status: CP | PRN
Start: 2021-06-27 — End: 2022-06-27

## 2021-06-27 MED ORDER — LOSARTAN 50 MG TABLET
ORAL_TABLET | Freq: Every day | ORAL | 3 refills | 90 days | Status: CP
Start: 2021-06-27 — End: 2022-06-27

## 2021-06-28 ENCOUNTER — Ambulatory Visit: Admit: 2021-06-28 | Discharge: 2021-06-29 | Payer: PRIVATE HEALTH INSURANCE | Attending: Neurology | Primary: Neurology

## 2021-07-26 DIAGNOSIS — G609 Hereditary and idiopathic neuropathy, unspecified: Principal | ICD-10-CM

## 2021-07-26 DIAGNOSIS — R202 Paresthesia of skin: Principal | ICD-10-CM

## 2021-07-26 DIAGNOSIS — R200 Anesthesia of skin: Principal | ICD-10-CM

## 2021-07-26 DIAGNOSIS — F5101 Primary insomnia: Principal | ICD-10-CM

## 2021-07-26 MED ORDER — TRAZODONE 50 MG TABLET
ORAL_TABLET | 3 refills | 0 days | Status: CP
Start: 2021-07-26 — End: ?

## 2021-07-28 MED ORDER — GABAPENTIN 300 MG CAPSULE
ORAL_CAPSULE | Freq: Three times a day (TID) | ORAL | 0 refills | 90 days | Status: CP
Start: 2021-07-28 — End: 2021-10-26

## 2021-08-21 DIAGNOSIS — F5101 Primary insomnia: Principal | ICD-10-CM

## 2021-08-21 MED ORDER — TRAZODONE 50 MG TABLET
ORAL_TABLET | Freq: Every evening | ORAL | 1 refills | 90 days | Status: CP
Start: 2021-08-21 — End: ?

## 2021-09-24 ENCOUNTER — Ambulatory Visit: Admit: 2021-09-24 | Discharge: 2021-09-25 | Payer: MEDICARE

## 2021-09-24 DIAGNOSIS — R202 Paresthesia of skin: Principal | ICD-10-CM

## 2021-09-24 DIAGNOSIS — Z114 Encounter for screening for human immunodeficiency virus [HIV]: Principal | ICD-10-CM

## 2021-09-24 DIAGNOSIS — Z6833 Body mass index (BMI) 33.0-33.9, adult: Principal | ICD-10-CM

## 2021-09-24 DIAGNOSIS — G609 Hereditary and idiopathic neuropathy, unspecified: Principal | ICD-10-CM

## 2021-09-24 DIAGNOSIS — R29701 NIHSS score 1: Principal | ICD-10-CM

## 2021-09-24 DIAGNOSIS — R200 Anesthesia of skin: Principal | ICD-10-CM

## 2021-10-10 ENCOUNTER — Ambulatory Visit: Admit: 2021-10-10 | Discharge: 2021-10-11 | Payer: MEDICARE

## 2021-10-23 ENCOUNTER — Ambulatory Visit: Admit: 2021-10-23 | Discharge: 2021-10-24 | Payer: MEDICARE

## 2021-10-25 DIAGNOSIS — G609 Hereditary and idiopathic neuropathy, unspecified: Principal | ICD-10-CM

## 2021-10-25 DIAGNOSIS — R202 Paresthesia of skin: Principal | ICD-10-CM

## 2021-10-25 DIAGNOSIS — R200 Anesthesia of skin: Principal | ICD-10-CM

## 2021-10-25 MED ORDER — GABAPENTIN 300 MG CAPSULE
ORAL_CAPSULE | Freq: Three times a day (TID) | ORAL | 11 refills | 30 days | Status: CP
Start: 2021-10-25 — End: 2022-10-20

## 2021-10-29 DIAGNOSIS — E785 Hyperlipidemia, unspecified: Principal | ICD-10-CM

## 2021-11-22 ENCOUNTER — Other Ambulatory Visit: Admit: 2021-11-22 | Discharge: 2021-11-23 | Payer: MEDICARE

## 2021-11-22 DIAGNOSIS — E785 Hyperlipidemia, unspecified: Principal | ICD-10-CM

## 2021-11-26 DIAGNOSIS — G629 Polyneuropathy, unspecified: Principal | ICD-10-CM

## 2021-11-26 MED ORDER — CELECOXIB 200 MG CAPSULE
ORAL_CAPSULE | Freq: Every day | ORAL | 2 refills | 30 days | Status: CP | PRN
Start: 2021-11-26 — End: 2022-11-26

## 2021-11-27 ENCOUNTER — Ambulatory Visit: Admit: 2021-11-27 | Discharge: 2021-11-28 | Payer: MEDICARE

## 2021-11-27 DIAGNOSIS — I10 Essential (primary) hypertension: Principal | ICD-10-CM

## 2021-11-27 DIAGNOSIS — Z23 Encounter for immunization: Principal | ICD-10-CM

## 2021-11-27 DIAGNOSIS — G629 Polyneuropathy, unspecified: Principal | ICD-10-CM

## 2021-11-27 DIAGNOSIS — E785 Hyperlipidemia, unspecified: Principal | ICD-10-CM

## 2021-11-27 DIAGNOSIS — F5101 Primary insomnia: Principal | ICD-10-CM

## 2021-11-27 MED ORDER — DULOXETINE 20 MG CAPSULE,DELAYED RELEASE
ORAL_CAPSULE | Freq: Every day | ORAL | 3 refills | 30 days | Status: CP
Start: 2021-11-27 — End: 2022-11-27

## 2021-12-19 DIAGNOSIS — G629 Polyneuropathy, unspecified: Principal | ICD-10-CM

## 2021-12-19 MED ORDER — DULOXETINE 20 MG CAPSULE,DELAYED RELEASE
ORAL_CAPSULE | Freq: Every day | ORAL | 2 refills | 90 days | Status: CP
Start: 2021-12-19 — End: ?

## 2021-12-21 DIAGNOSIS — E785 Hyperlipidemia, unspecified: Principal | ICD-10-CM

## 2021-12-23 MED ORDER — ATORVASTATIN 10 MG TABLET
ORAL_TABLET | Freq: Every day | ORAL | 3 refills | 90 days | Status: CP
Start: 2021-12-23 — End: ?

## 2021-12-25 ENCOUNTER — Ambulatory Visit: Admit: 2021-12-25 | Discharge: 2021-12-26 | Payer: MEDICARE

## 2021-12-25 DIAGNOSIS — G629 Polyneuropathy, unspecified: Principal | ICD-10-CM

## 2021-12-25 MED ORDER — DULOXETINE 30 MG CAPSULE,DELAYED RELEASE
ORAL_CAPSULE | 4 refills | 0 days | Status: CP
Start: 2021-12-25 — End: ?

## 2021-12-25 MED ORDER — ERGOCALCIFEROL (VITAMIN D2) 1,250 MCG (50,000 UNIT) CAPSULE
ORAL_CAPSULE | ORAL | 11 refills | 28 days | Status: CP
Start: 2021-12-25 — End: 2022-12-25

## 2021-12-25 MED ORDER — PREGABALIN 50 MG CAPSULE
ORAL_CAPSULE | Freq: Three times a day (TID) | ORAL | 4 refills | 30 days | Status: CP
Start: 2021-12-25 — End: 2022-05-24

## 2022-01-07 DIAGNOSIS — Z5181 Encounter for therapeutic drug level monitoring: Principal | ICD-10-CM

## 2022-01-07 DIAGNOSIS — G35 Multiple sclerosis: Principal | ICD-10-CM

## 2022-01-07 DIAGNOSIS — Z1159 Encounter for screening for other viral diseases: Principal | ICD-10-CM

## 2022-01-08 ENCOUNTER — Ambulatory Visit: Admit: 2022-01-08 | Discharge: 2022-01-09 | Payer: MEDICARE

## 2022-01-15 DIAGNOSIS — G35 Multiple sclerosis: Principal | ICD-10-CM

## 2022-01-15 MED ORDER — KESIMPTA PEN 20 MG/0.4 ML SUBCUTANEOUS PEN INJECTOR
1 refills | 0 days | Status: CP
Start: 2022-01-15 — End: ?
  Filled 2022-01-21: qty 1.2, 28d supply, fill #0

## 2022-01-15 NOTE — Unmapped (Signed)
[facility] Specialty Medication Onboarding    Specialty Medication: Kesimpta  Prior Authorization: Not Required   Financial Assistance: No - copay  <$25  Final Copay/Day Supply: $10.35 / 28    Insurance Restrictions: None     Notes to Pharmacist:     The triage team has completed the benefits investigation and has determined that the patient is able to fill this medication at [facility]. Please contact the patient to complete the onboarding or follow up with the prescribing physician as needed.

## 2022-01-17 MED ORDER — EMPTY CONTAINER
3 refills | 0 days
Start: 2022-01-17 — End: ?

## 2022-01-17 NOTE — Unmapped (Unsigned)
[facility]   Patient Onboarding/Medication Counseling    Catherine Campos is a 66 y.o. female with multiple sclerosis who I am counseling today on initiation of therapy.  I am speaking to {Blank:19197::the patient,the patient's caregiver, ***,the patient's family member, ***,***}.    Was a Nurse, learning disability used for this call? No    Verified patient's date of birth / HIPAA.    Specialty medication(s) to be sent: Neurology: Kesimpta      Non-specialty medications/supplies to be sent: sharps?      Medications not needed at this time: n/a         Kesimpta (ofatumumab)    Medication & Administration     Dosage: Inject the contents of 1 pen (20mg ) under the skin once weekly at week 0, 1 and 2 then inject once monthly beginning at week 4.    Lab tests required prior to treatment initiation:  Serum Immunoglobulins: Serum immunoglobulin testing resulted in adequate levels.  Hepatitis B: Hepatitis B serology studies are complete and non-reactive.    Administration: Inject under the skin of the abdomen, upper thigh or outer upper arm. Rotate injection sites.    [facility] pre-medication recommendation: Take 650mg  of acetaminophen and 25mg  of diphenhydramine 30 minutes prior to [facility] injection.    Injection instructions:  Remove 1 pen from the refrigerator and let stand at room temperature for 15 to 30 minutes.  Examine the pen and ensure the following:  The expiration date has not passed.  The liquid inside the pen is clear to slightly cloudy and does not contain any visible particles.  You may see a small air bubble; this is normal.  Choose your injection site and clean with an alcohol wipe; allow to air dry completely.  Always avoid areas where the skin is tender, bruised, red, scaly or hard or where there are moles, scars or stretch marks.  Remove the cap from the pen by twisting in the direction of the arrow.  Discard the cap. Do not try to reattach the cap.  Once the cap is removed the Kesimpta pen must allow to air dry completely.  Always avoid areas where the skin is tender, bruised, red, scaly or hard or where there are moles, scars or stretch marks.  Remove the cap from the pen by twisting in the direction of the arrow.  Discard the cap. Do not try to reattach the cap.  Once the cap is removed the Kesimpta pen must be used within 5 minutes.  Hold the pen at a 90 degree angle to your clean injection site.  Press the pen firmly against your skin to start the injection. You will hear a loud click.  Continue to hold the pen firmly on your skin throughout the entire injection.  You will hear a second loud click which signals that the injection is almost complete.  You will know that the injection is complete when the green indicator completely fills the window and has stopped moving.  Remove the pen from your skin and discard.    Adherence/Missed dose instructions: Administer a missed dose as soon as you remember and start a new schedule based on that date.    Goals of Therapy     To slow the progression of multiple sclerosis    Side Effects & Monitoring Parameters   Injection site irritation  Headache   Flu-like symptoms  Mild fever  Signs of a common cold    The following side effects should be reported  to the provider:  Signs of an allergic reaction  Signs of infection (fever, chills, sore throat, new or worsening cough etc.)  Signs of liver problems (yellowing of the skin/eyes, darkened urine, light-colored stools, severe stomach pain/vomiting)        Contraindications, Warnings, & Precautions     Required pre-testing:  Hep B  IgG  CBC with differential  CMP  Contraindications:   Active Hepatitis B infection  Warnings and Precautions:  Infections: an increased risk of infection has been observed with similar medications.  Most common infections in Kesimpta-treated patients includes upper respiratory tract and urinary tract infections.  Hepatitis B virus: reactivation is possible though there have been no reports diagnosis.  Reduction in immunoglobulins: decreased immunoglobulin levels have been observed in patients take Kesimpta. Levels of quantitative serum immunoglobulins should be monitored during treatment and after discontinuation until B-cell repletion.  Consider discontinuing Kesimpta if a patient with low immunoglobulins develops a serious or recurrent infection.  Consider discontinuing Kesimpta if a patient has prolonged hypogammaglobulinemia that requires treatment.  Fetal risk: Animal data suggests Kesimpta can cause fetal harm. Females of reproductive potential should use effective contraception during treatment and for at least 6 months after the last dose.    Drug/Food Interactions     Medication list reviewed in Epic. The patient was instructed to inform the care team before taking any new medications or supplements. No drug interactions identified.   All necessary immunizations should be completed at least 4 weeks prior to initiation of Kesimpta.   Kesimpta may interfere with the effectiveness of inactivated vaccines.  Live vaccines are not recommended during treatment with Kesimpta and after discontinuation until B-cell repletion.    Storage, Handling Precautions, & Disposal   Kesimpta should be stored in the refrigerator.  Do not shake Kesimpta pens.  Dispose of used Kesimpta pens in a sharps container.          Current Medications (including OTC/herbals), Comorbidities and Allergies     Current Outpatient Medications   Medication Sig Dispense Refill    albuterol HFA 90 mcg/actuation inhaler Inhale 2 puffs every six (6) hours as needed for wheezing or shortness of breath. 8 g 0    atorvastatin (LIPITOR) 10 MG tablet TAKE 1 TABLET BY MOUTH EVERY DAY 90 tablet 3    celecoxib (CELEBREX) 200 MG capsule TAKE 1 CAPSULE (200 MG TOTAL) BY MOUTH DAILY AS NEEDED FOR PAIN. USE IN PLACE OF IBUPROFEN (Patient not taking: Reported on 12/25/2021) 30 capsule 2    cyanocobalamin, vitamin B-12, (VITAMIN B-12 ORAL) Take by mouth.      DULoxetine (CYMBALTA) 30 MG capsule Take 30 mg in the morning for one week, then increase to 30 mg in the morning and 30 mg in the evening 60 capsule 4    ergocalciferol-1,250 mcg, 50,000 unit, (DRISDOL) 1,250 mcg (50,000 unit) capsule Take 1 capsule (1,250 mcg total) by mouth once a week. 4 capsule 11    losartan (COZAAR) 50 MG tablet Take 1 tablet (50 mg total) by mouth daily. 90 tablet 3    multivitamin (TAB-A-VITE/THERAGRAN) per tablet Take 1 tablet by mouth daily.      ofatumumab (KESIMPTA PEN) 20 mg/0.4 mL PnIj Inject contents of pen subcutaneously on week 0, 1 and 2. Skip week 3. Inject on week 4 and every 4 weeks thereafter. Take Benadryl 25 mg and Tylenol 650 mg thirty minutes prior to starting. 1.2 mL 1    pregabalin (LYRICA) 50 MG capsule Take 2 capsules (100  mg total) by mouth Three (3) times a day. 180 capsule 4    traZODone (DESYREL) 50 MG tablet Take 1 tablet (50 mg total) by mouth nightly. 90 tablet 1    TURMERIC ROOT EXTRACT ORAL Take by mouth. (Patient not taking: Reported on 12/25/2021)       No current facility-administered medications for this visit.       Allergies   Allergen Reactions    Codeine Hives and Swelling     Tongue swelling    Penicillins Hives and Swelling     Has patient had a PCN reaction causing immediate rash, facial/tongue/throat swelling, SOB or lightheadedness with hypotension: Yes  Has patient had a PCN reaction causing severe rash involving mucus membranes or skin necrosis: No  Has patient had a PCN reaction that required hospitalization No  Has patient had a PCN reaction occurring within the last 10 years: Yes  If all of the above answers are NO, then may proceed with Cephalosporin use.       Patient Active Problem List   Diagnosis    RSV infection    Hypertension    Hyperlipidemia    Neuropathy    Primary insomnia    Peripheral polyneuropathy       Reviewed and up to date in Epic.    Appropriateness of Therapy     Acute infections noted within Epic:  No active infections  Patient reported infection: {Blank single:19197::None,***- patient reported to provider,***- pharmacy reported to provider}    Is medication and dose appropriate based on diagnosis and infection status? {Blank single:19197::Yes,No - evidence provided by prescriber in *** note}    Prescription has been clinically reviewed: Yes      Baseline Quality of Life Assessment      How many days over the past month did your MS  keep you from your normal activities? For example, brushing your teeth or getting up in the morning. {Blank:19197::***,0,Patient declined to answer}    Financial Information     Medication Assistance provided: Prior Authorization    Anticipated copay of $10.35 reviewed with patient. Verified delivery address.    Delivery Information     Scheduled delivery date: ***    Expected start date: ***    Medication will be delivered via {Blank:19197::UPS,Next Day Courier,Same Day Courier,Clinic Courier - *** clinic,***} to the {Blank:19197::prescription,temporary} address in Epic WAM.  This shipment {Blank single:19197::will,will not} require a signature.      Explained the services we provide at [facility] and that each month we would call to set up refills.  Stressed importance of returning phone calls so that we could ensure they receive their medications in time each month.  Informed patient that we should be setting up refills 7-10 days prior to when they will run out of medication.  A pharmacist will reach out to perform a clinical assessment periodically.  Informed patient that a welcome packet, containing information about our pharmacy and other support services, a Notice of Privacy Practices, and a drug information handout will be sent.      The patient or caregiver noted above participated in the development of this care plan and knows that they can request review of or adjustments to the care plan at any time.      Patient or caregiver verbalized understanding of the above information as well as how to contact the pharmacy at [phone] option 4 with any questions/concerns.  The pharmacy is open Monday through Friday 8:30am-4:30pm.  A pharmacist is available 24/7 via pager to answer any clinical questions they may have.    Patient Specific Needs     Does the patient have any physical, cognitive, or cultural barriers? {Blank single:19197::No,Yes - ***}    Does the patient have adequate living arrangements? (i.e. the ability to store and take their medication appropriately) {Blank single:19197::Yes,No - ***}    Did you identify any home environmental safety or security hazards? {Blank single:19197::No,Yes - ***}    Patient prefers to have medications discussed with  {Blank single:19197::Patient,Family Member,Caregiver,Other}     Is the patient or caregiver able to read and understand education materials at a high school level or above? {Blank single:19197::No,Yes}    Patient's primary language is  {Blank single:19197::English,Spanish,***}     Is the patient high risk? {sschighriskpts:78327}    SOCIAL DETERMINANTS OF HEALTH     At the [facility], we have learned that life circumstances - like trouble affording food, housing, utilities, or transportation can affect the health of many of our patients.   That is why we wanted to ask: are you currently experiencing any life circumstances that are negatively impacting your health and/or quality of life? {YES/NO/PATIENTDECLINED:93004}    Social Determinants of Health     Financial Resource Strain: Low Risk  (12/06/2019)    Overall Financial Resource Strain (CARDIA)     Difficulty of Paying Living Expenses: Not hard at all   Internet Connectivity: Not on file   Food Insecurity: No Food Insecurity (06/27/2021)    Hunger Vital Sign     Worried About Running Out of Food in the Last Year: Never true     Ran Out of Food in the Last Year: Never true   Tobacco Use: Medium Risk (12/25/2021)    Patient History     Smoking Tobacco Use: Former     Smokeless Tobacco Use: Never     Passive Exposure: Not on file   Housing/Utilities: Low Risk  (12/06/2019)    Housing/Utilities     Within the past 12 months, have you ever stayed: outside, in a car, in a tent, in an overnight shelter, or temporarily in someone else's home (i.e. couch-surfing)?: No     Are you worried about losing your housing?: No     Within the past 12 months, have you been unable to get utilities (heat, electricity) when it was really needed?: No   Alcohol Use: Not on file   Transportation Needs: No Transportation Needs (12/06/2019)    PRAPARE - Transportation     Lack of Transportation (Medical): No     Lack of Transportation (Non-Medical): No   Substance Use: Not on file   Health Literacy: Low Risk  (12/28/2020)    Health Literacy     : Never   Physical Activity: Not on file   Interpersonal Safety: Not on file   Stress: Not on file   Intimate Partner Violence: Not on file   Depression: Not at risk (11/27/2021)    PHQ-2     PHQ-2 Score: 0   Social Connections: Not on file       Would you be willing to receive help with any of the needs that you have identified today? {Yes/No/Not applicable:93005}       Arnold Long, PharmD  [facility] Specialty Pharmacist

## 2022-01-21 MED FILL — EMPTY CONTAINER: 120 days supply | Qty: 1 | Fill #0

## 2022-01-27 NOTE — Unmapped (Signed)
Assessment and Plan:     Neuropathy    Hyperlipidemia, unspecified hyperlipidemia type    Hypertension, unspecified type    Need for pneumococcal vaccine  -     Pneumococcal Conjugate Vaccine 20-Valent      Multiple diagnoses above reviewed with patient  Continue current medication management  Patient was not able to receive Lyrica refill due to the pharmacy only having 100 mg tablets instead of 50 mg. Her neurologist has yet to contact her and refill the medication, therefore I prescribed a 30 day supply of Lyrica 100 mg three times daily.  Rx as per orders; reviewed risks/benefits, possible side effects and patient verbalizes understanding  Referrals/labs as per orders above  Recommend supportive treatment with lifestyle modifications with diet/exercise  Follow up in 3-4 months or sooner prn     Barriers to recommended plan: None identified    Return in about 4 months (around 05/30/2022) for Recheck.    Subjective:     HPI: Catherine Campos is a 66 y.o. female here for CDM. Last seen by me on 11/27/21.     HM: Patient reports that the results of Cologuard test returned yesterday and was negative. She will bring in results at next office visit.     Patient is agreeable to receiving the Prevnar 20 vaccine today. She plans to get COVID booster at the pharmacy and Shingrix at her next visit.     MS: Patient was experiencing peripheral polyneuropathy leading to having a MRI. MRI revealed multiple subcortical and periventricular white matter lesions. She was then diagnosed with MS by the neurologist on 12/25/21.     Neurologist discontinued gabapentin and started Lyrica 100 mg three times daily. Also increased duloxetine from 20 mg to 30 mg twice daily and Vitamin D 50,000 international units per week.    She also took her first Kesimpta injection on Saturday.    Hypertension: Patient with history of hypertension manages with losartan 50 mg daily. Blood pressure at triage today 139/75. Denies CP, SOB, L/D or peripheral edema.     Hyperlipidemia: Managed with atorvastatin 10 mg daily.     Weight gain: She has been gaining weight despite mostly only eating once daily. She does not have an appetite. Eating mainly chicken, fish, and seafood. Does not eat a lot of carbohydrates or soda.     Wt Readings from Last 12 Encounters:   01/28/22 77 kg (169 lb 12.8 oz)   12/25/21 75.8 kg (167 lb)   11/27/21 74.9 kg (165 lb 0.6 oz)   09/24/21 74.4 kg (164 lb)   06/27/21 75.8 kg (167 lb 0.6 oz)   05/27/21 74.8 kg (165 lb)   12/28/20 76.2 kg (168 lb)   11/30/20 74.8 kg (165 lb)   07/17/20 67.6 kg (149 lb)   06/07/20 67.7 kg (149 lb 3.2 oz)   05/31/20 65.8 kg (145 lb)       HPI  ROS:   Review of Systems     Review of systems negative unless otherwise noted as per HPI.      The following portions of the patient's history were reviewed and updated as appropriate: allergies, current medications, past family history, past medical history, past social history, past surgical history and problem list.     Objective:     Vitals:    01/28/22 1402   BP: 139/75   Pulse: 85   Temp: 36.2 ??C (97.2 ??F)     Body mass index is 34.3 kg/m??.  Physical Exam  Vitals and nursing note reviewed.   Constitutional:       General: She is not in acute distress.     Appearance: Normal appearance. She is not ill-appearing or toxic-appearing.   Cardiovascular:      Rate and Rhythm: Normal rate and regular rhythm.      Heart sounds: Normal heart sounds.   Pulmonary:      Effort: Pulmonary effort is normal. No respiratory distress.      Breath sounds: Normal breath sounds.   Musculoskeletal:      Cervical back: Neck supple.      Comments: Trace bilateral edema.    Neurological:      Mental Status: She is alert.                      Allergies:     Codeine and Penicillins      Payton Mccallum, MD    [facility]:     Medication adherence and barriers to the treatment plan have been addressed. Opportunities to optimize healthy behaviors have been discussed. Patient / caregiver voiced understanding.      I attest that I, Mamie Nick, personally documented this note while acting as scribe for Payton Mccallum, MD.      Mamie Nick, Scribe.  01/28/2022     The documentation recorded by the scribe accurately reflects the service I personally performed and the decisions made by me.    Payton Mccallum, MD

## 2022-01-28 ENCOUNTER — Ambulatory Visit: Admit: 2022-01-28 | Discharge: 2022-01-29 | Payer: MEDICARE

## 2022-01-28 DIAGNOSIS — Z23 Encounter for immunization: Principal | ICD-10-CM

## 2022-01-28 DIAGNOSIS — G629 Polyneuropathy, unspecified: Principal | ICD-10-CM

## 2022-01-28 LAB — HM FIT-DNA STOOL TEST: HM FIT DNA TEST: NEGATIVE

## 2022-01-28 MED ORDER — PREGABALIN 100 MG CAPSULE
ORAL_CAPSULE | Freq: Three times a day (TID) | ORAL | 0 refills | 10 days
Start: 2022-01-28 — End: 2023-01-28

## 2022-01-28 NOTE — Unmapped (Signed)
Patient in clinic for office visit, Prevnar 20 vaccine administered per protocol,NCIR reviewed and vaccine accuracy verified with teammates: Sydell Axon, CMA, patient eligible to receive vacstock: Private Vaccine, vaccine administered Right Deltoid, pt tolerated well, no s/s of a reaction noted, VIS given to patient

## 2022-01-29 MED ORDER — PREGABALIN 100 MG CAPSULE
ORAL_CAPSULE | Freq: Three times a day (TID) | ORAL | 11 refills | 30 days | Status: CP
Start: 2022-01-29 — End: 2023-01-24

## 2022-01-29 NOTE — Unmapped (Signed)
Patient LVM this afternoon stating that her pharmacy is out of pregabalin 50 mg, which she takes 2 tablets TID. [facility] is requesting a new prescription for pregabalin 100 mg, 1 tablet TID.    Refill request received from patient.      Medication Requested: pregabalin 100 mg  Last Office Visit: 12/25/2021   Next Office Visit: 04/08/2022  Last Prescriber: Dr. Lenise Arena    Nurse refill requirements met? No  If not met, why: New dosage requested     Sent to: Provider for signing  If sent to provider, which provider?: Dr. Lenise Arena

## 2022-02-05 NOTE — Unmapped (Signed)
[facility] Clinical Assessment & Refill Coordination Note    Catherine Campos, DOB: 02-14-1955  Phone: [phone] (home)     All above HIPAA information was verified with patient.     Was a Nurse, learning disability used for this call? No    Specialty Medication(s):   Neurology: Kesimpta     Current Outpatient Medications   Medication Sig Dispense Refill    albuterol HFA 90 mcg/actuation inhaler Inhale 2 puffs every six (6) hours as needed for wheezing or shortness of breath. 8 g 0    atorvastatin (LIPITOR) 10 MG tablet TAKE 1 TABLET BY MOUTH EVERY DAY 90 tablet 3    cyanocobalamin, vitamin B-12, (VITAMIN B-12 ORAL) Take by mouth.      DULoxetine (CYMBALTA) 30 MG capsule Take 30 mg in the morning for one week, then increase to 30 mg in the morning and 30 mg in the evening 60 capsule 4    empty container Misc Use as directed to dispose of injectable medications 1 each 3    ergocalciferol-1,250 mcg, 50,000 unit, (DRISDOL) 1,250 mcg (50,000 unit) capsule Take 1 capsule (1,250 mcg total) by mouth once a week. 4 capsule 11    losartan (COZAAR) 50 MG tablet Take 1 tablet (50 mg total) by mouth daily. 90 tablet 3    multivitamin (TAB-A-VITE/THERAGRAN) per tablet Take 1 tablet by mouth daily.      ofatumumab (KESIMPTA PEN) 20 mg/0.4 mL PnIj Inject contents of pen subcutaneously on week 0, 1 and 2. Skip week 3. Inject on week 4 and every 4 weeks thereafter. Take Benadryl 25 mg and Tylenol 650 mg thirty minutes prior to starting. 1.2 mL 1    pregabalin (LYRICA) 100 MG capsule Take 1 capsule (100 mg total) by mouth Three (3) times a day. 90 capsule 11    traZODone (DESYREL) 50 MG tablet Take 1 tablet (50 mg total) by mouth nightly. 90 tablet 1    TURMERIC ROOT EXTRACT ORAL Take by mouth. (Patient not taking: Reported on 12/25/2021)       No current facility-administered medications for this visit.        Changes to medications: Catherine Campos reports no changes at this time.    Allergies   Allergen Reactions Codeine Hives and Swelling     Tongue swelling    Penicillins Hives and Swelling     Has patient had a PCN reaction causing immediate rash, facial/tongue/throat swelling, SOB or lightheadedness with hypotension: Yes  Has patient had a PCN reaction causing severe rash involving mucus membranes or skin necrosis: No  Has patient had a PCN reaction that required hospitalization No  Has patient had a PCN reaction occurring within the last 10 years: Yes  If all of the above answers are NO, then may proceed with Cephalosporin use.       Changes to allergies: No    SPECIALTY MEDICATION ADHERENCE     Kesimpta 20  mg/0.74ml : 14 days of medicine on hand       Medication Adherence    Patient reported X missed doses in the last month: 0  Specialty Medication: Kesimpta  Patient is on additional specialty medications: No  Informant: patient                            Specialty medication(s) dose(s) confirmed: Regimen is correct and unchanged.     Are there any concerns with adherence? No    Adherence  counseling provided? Not needed    CLINICAL MANAGEMENT AND INTERVENTION      Clinical Benefit Assessment:    Do you feel the medicine is effective or helping your condition?  Unable to determine at this time    Clinical Benefit counseling provided? Not needed    Adverse Effects Assessment:    Are you experiencing any side effects? No    Are you experiencing difficulty administering your medicine? No    Quality of Life Assessment:    Quality of Life    Rheumatology  Oncology  Dermatology  Cystic Fibrosis          How many days over the past month did your MS  keep you from your normal activities? For example, brushing your teeth or getting up in the morning. 0    Have you discussed this with your provider? Not needed    Acute Infection Status:    Acute infections noted within Epic:  No active infections  Patient reported infection: None    Therapy Appropriateness:    Is therapy appropriate and patient progressing towards therapeutic goals? Yes, therapy is appropriate and should be continued    DISEASE/MEDICATION-SPECIFIC INFORMATION      For patients on injectable medications: Patient currently has 1 doses left.  Next injection is scheduled for 02/08/22 (First monthly dose is due 02/22/22).    Multiple Sclerosis: Have you experienced any flares in the last month? No  Has this been reported to your provider? N/a  What was the outcome of the flare? N/a    PATIENT SPECIFIC NEEDS     Does the patient have any physical, cognitive, or cultural barriers? No    Is the patient high risk? No    Did the patient require a clinical intervention? No    Does the patient require physician intervention or other additional services (i.e., nutrition, smoking cessation, social work)? No    SOCIAL DETERMINANTS OF HEALTH     At the [facility], we have learned that life circumstances - like trouble affording food, housing, utilities, or transportation can affect the health of many of our patients.   That is why we wanted to ask: are you currently experiencing any life circumstances that are negatively impacting your health and/or quality of life? Patient declined to answer    Social Determinants of Health     Financial Resource Strain: Low Risk  (12/06/2019)    Overall Financial Resource Strain (CARDIA)     Difficulty of Paying Living Expenses: Not hard at all   Internet Connectivity: Not on file   Food Insecurity: No Food Insecurity (06/27/2021)    Hunger Vital Sign     Worried About Running Out of Food in the Last Year: Never true     Ran Out of Food in the Last Year: Never true   Tobacco Use: Medium Risk (01/28/2022)    Patient History     Smoking Tobacco Use: Former     Smokeless Tobacco Use: Never     Passive Exposure: Not on file   Housing/Utilities: Low Risk  (12/06/2019)    Housing/Utilities     Within the past 12 months, have you ever stayed: outside, in a car, in a tent, in an overnight shelter, or temporarily in someone else's home (i.e. couch-surfing)?: No     Are you worried about losing your housing?: No     Within the past 12 months, have you been unable to get utilities (heat, electricity) when it was  really needed?: No   Alcohol Use: Not on file   Transportation Needs: No Transportation Needs (12/06/2019)    PRAPARE - Transportation     Lack of Transportation (Medical): No     Lack of Transportation (Non-Medical): No   Substance Use: Not on file   Health Literacy: Low Risk  (12/28/2020)    Health Literacy     : Never   Physical Activity: Not on file   Interpersonal Safety: Not on file   Stress: Not on file   Intimate Partner Violence: Not on file   Depression: Not at risk (11/27/2021)    PHQ-2     PHQ-2 Score: 0   Social Connections: Not on file       Would you be willing to receive help with any of the needs that you have identified today? Not applicable       SHIPPING     Specialty Medication(s) to be Shipped:   Neurology: Kesimpta    Other medication(s) to be shipped: No additional medications requested for fill at this time     Changes to insurance: No    Delivery Scheduled: Yes, Expected medication delivery date: 02/18/22.     Medication will be delivered via Same Day Courier to the confirmed prescription address in [facility].    The patient will receive a drug information handout for each medication shipped and additional FDA Medication Guides as required.  Verified that patient has previously received a Conservation officer, historic buildings and a Surveyor, mining.    The patient or caregiver noted above participated in the development of this care plan and knows that they can request review of or adjustments to the care plan at any time.      All of the patient's questions and concerns have been addressed.    Arnold Long, PharmD   [facility] Specialty Pharmacist

## 2022-02-18 MED FILL — KESIMPTA PEN 20 MG/0.4 ML SUBCUTANEOUS PEN INJECTOR: 28 days supply | Qty: 0.4 | Fill #1

## 2022-02-25 DIAGNOSIS — G35 Multiple sclerosis: Principal | ICD-10-CM

## 2022-02-25 MED ORDER — KESIMPTA PEN 20 MG/0.4 ML SUBCUTANEOUS PEN INJECTOR
0 refills | 0 days | Status: CP
Start: 2022-02-25 — End: ?

## 2022-02-25 NOTE — Unmapped (Signed)
This encounter was created in error - please disregard.

## 2022-02-25 NOTE — Unmapped (Signed)
[facility] Clinical Intervention    Type of intervention: Medication administration    Medication involved: Kesimpta    Problem identified: Wes, RN from Neurology clinic called pharmacy and stated that pt had a misfire of Kesimpta and did not receive majority of her medication. He is inquiring on next steps to get a replacement.    Intervention performed: I informed Wes that [place] will need to call [employer] at [phone] to report misfire. If approved, a new prescription will need to be sent to [facility] to dispense replacement. I also informed Wes to have pt call our pharmacy if she would like to review injection technique of Kesimpta. He verbalized understanding.     Follow-up needed: Will follow-up ~2 weeks before next Kesimpta refill is due.      Approximate time spent: 10-15 minutes    Clinical evidence used to support intervention: Drug information resource    Result of the intervention: Improved therapy effectiveness    Oliva Bustard, PharmD   [facility] Specialty Pharmacist

## 2022-02-27 DIAGNOSIS — G35 Multiple sclerosis: Principal | ICD-10-CM

## 2022-02-27 MED ORDER — KESIMPTA PEN 20 MG/0.4 ML SUBCUTANEOUS PEN INJECTOR
0 refills | 0 days | Status: CP
Start: 2022-02-27 — End: ?

## 2022-03-06 NOTE — Unmapped (Signed)
Patient contacting clinic due to receiving a call from unknown caller.    Reviewed patient chart and advised patient Patient Outreach was calling her to schedule a Medicare Annual Wellness visit.    Patient reports she already has one scheduled for Feb 12 at home with [employer].    Advised telephone discussion would be documented as updated.

## 2022-03-06 NOTE — Unmapped (Signed)
Abstraction Result Flowsheet Data    This patient's last AWV date: [facility] Last Medicare Wellness Visit Date: Not Found  This patients last WCC/CPE date: : 12/28/2020      Reason for Encounter  Reason for Encounter: Outreach  Primary Reason for Outreach: AWV  Text Message: No  MyChart Message: No  Outreach Call Outcome: No voicemail available

## 2022-03-12 DIAGNOSIS — G35 Multiple sclerosis: Principal | ICD-10-CM

## 2022-03-12 MED ORDER — KESIMPTA PEN 20 MG/0.4 ML SUBCUTANEOUS PEN INJECTOR
0 refills | 0 days | Status: CP
Start: 2022-03-12 — End: ?
  Filled 2022-03-17: qty 0.4, 28d supply, fill #0

## 2022-03-12 NOTE — Unmapped (Signed)
[facility] Clinical Assessment & Refill Coordination Note    Catherine Campos, DOB: 1955-04-10  Phone: [phone] (home)     All above HIPAA information was verified with patient.     Was a Nurse, learning disability used for this call? No    Specialty Medication(s):   Neurology: Kesimpta     Current Outpatient Medications   Medication Sig Dispense Refill    albuterol HFA 90 mcg/actuation inhaler Inhale 2 puffs every six (6) hours as needed for wheezing or shortness of breath. 8 g 0    atorvastatin (LIPITOR) 10 MG tablet TAKE 1 TABLET BY MOUTH EVERY DAY 90 tablet 3    cyanocobalamin, vitamin B-12, (VITAMIN B-12 ORAL) Take by mouth.      DULoxetine (CYMBALTA) 30 MG capsule Take 30 mg in the morning for one week, then increase to 30 mg in the morning and 30 mg in the evening 60 capsule 4    empty container Misc Use as directed to dispose of injectable medications 1 each 3    ergocalciferol-1,250 mcg, 50,000 unit, (DRISDOL) 1,250 mcg (50,000 unit) capsule Take 1 capsule (1,250 mcg total) by mouth once a week. 4 capsule 11    losartan (COZAAR) 50 MG tablet Take 1 tablet (50 mg total) by mouth daily. 90 tablet 3    multivitamin (TAB-A-VITE/THERAGRAN) per tablet Take 1 tablet by mouth daily.      ofatumumab (KESIMPTA PEN) 20 mg/0.4 mL PnIj Inject contents of pens subcutaneously every 4 weeks 1.2 mL 0    pregabalin (LYRICA) 100 MG capsule Take 1 capsule (100 mg total) by mouth Three (3) times a day. 90 capsule 11    traZODone (DESYREL) 50 MG tablet Take 1 tablet (50 mg total) by mouth nightly. 90 tablet 1    TURMERIC ROOT EXTRACT ORAL Take by mouth. (Patient not taking: Reported on 12/25/2021)       No current facility-administered medications for this visit.        Changes to medications: Dandria reports no changes at this time.    Allergies   Allergen Reactions    Codeine Hives and Swelling     Tongue swelling    Penicillins Hives and Swelling     Has patient had a PCN reaction causing immediate rash, facial/tongue/throat swelling, SOB or lightheadedness with hypotension: Yes  Has patient had a PCN reaction causing severe rash involving mucus membranes or skin necrosis: No  Has patient had a PCN reaction that required hospitalization No  Has patient had a PCN reaction occurring within the last 10 years: Yes  If all of the above answers are NO, then may proceed with Cephalosporin use.       Changes to allergies: No    SPECIALTY MEDICATION ADHERENCE     Kesimpta 20  mg/0.22ml : 0 days of medicine on hand       Medication Adherence    Patient reported X missed doses in the last month: 0  Specialty Medication: Kesimpta  Patient is on additional specialty medications: No  Informant: patient                            Specialty medication(s) dose(s) confirmed: Regimen is correct and unchanged.     Are there any concerns with adherence? No    Adherence counseling provided? Not needed    CLINICAL MANAGEMENT AND INTERVENTION      Clinical Benefit Assessment:    Do you feel  the medicine is effective or helping your condition?  Unable to determine at this time    Clinical Benefit counseling provided? Not needed    Adverse Effects Assessment:    Are you experiencing any side effects? No    Are you experiencing difficulty administering your medicine? No    Quality of Life Assessment:    Quality of Life    Rheumatology  Oncology  Dermatology  Cystic Fibrosis          How many days over the past month did your MS  keep you from your normal activities? For example, brushing your teeth or getting up in the morning. 0    Have you discussed this with your provider? Not needed    Acute Infection Status:    Acute infections noted within Epic:  No active infections  Patient reported infection: None    Therapy Appropriateness:    Is therapy appropriate and patient progressing towards therapeutic goals? Yes, therapy is appropriate and should be continued    DISEASE/MEDICATION-SPECIFIC INFORMATION      For patients on injectable medications: Patient currently has 0 doses left.  Next injection is scheduled for coming up.    Multiple Sclerosis: Have you experienced any flares in the last month? No  Has this been reported to your provider? No  What was the outcome of the flare? Not applicable    PATIENT SPECIFIC NEEDS     Does the patient have any physical, cognitive, or cultural barriers? No    Is the patient high risk? No    Did the patient require a clinical intervention? No    Does the patient require physician intervention or other additional services (i.e., nutrition, smoking cessation, social work)? No    SOCIAL DETERMINANTS OF HEALTH     At the [facility], we have learned that life circumstances - like trouble affording food, housing, utilities, or transportation can affect the health of many of our patients.   That is why we wanted to ask: are you currently experiencing any life circumstances that are negatively impacting your health and/or quality of life? Patient declined to answer    Social Determinants of Health     Financial Resource Strain: Low Risk  (12/06/2019)    Overall Financial Resource Strain (CARDIA)     Difficulty of Paying Living Expenses: Not hard at all   Internet Connectivity: Not on file   Food Insecurity: No Food Insecurity (06/27/2021)    Hunger Vital Sign     Worried About Running Out of Food in the Last Year: Never true     Ran Out of Food in the Last Year: Never true   Tobacco Use: Medium Risk (01/28/2022)    Patient History     Smoking Tobacco Use: Former     Smokeless Tobacco Use: Never     Passive Exposure: Not on file   Housing/Utilities: Low Risk  (12/06/2019)    Housing/Utilities     Within the past 12 months, have you ever stayed: outside, in a car, in a tent, in an overnight shelter, or temporarily in someone else's home (i.e. couch-surfing)?: No     Are you worried about losing your housing?: No     Within the past 12 months, have you been unable to get utilities (heat, electricity) when it was really needed?: No   Alcohol Use: Not on file   Transportation Needs: No Transportation Needs (12/06/2019)    PRAPARE - Transportation     Lack  of Transportation (Medical): No     Lack of Transportation (Non-Medical): No   Substance Use: Not on file   Health Literacy: Low Risk  (12/28/2020)    Health Literacy     : Never   Physical Activity: Not on file   Interpersonal Safety: Not on file   Stress: Not on file   Intimate Partner Violence: Not on file   Depression: Not at risk (11/27/2021)    PHQ-2     PHQ-2 Score: 0   Social Connections: Not on file       Would you be willing to receive help with any of the needs that you have identified today? Not applicable       SHIPPING     Specialty Medication(s) to be Shipped:   Neurology: Kesimpta    Other medication(s) to be shipped: No additional medications requested for fill at this time     Changes to insurance: No    Delivery Scheduled: Yes, Expected medication delivery date: 03/17/22.     Medication will be delivered via Same Day Courier to the confirmed prescription address in [facility].    The patient will receive a drug information handout for each medication shipped and additional FDA Medication Guides as required.  Verified that patient has previously received a Conservation officer, historic buildings and a Surveyor, mining.    The patient or caregiver noted above participated in the development of this care plan and knows that they can request review of or adjustments to the care plan at any time.      All of the patient's questions and concerns have been addressed.    Arnold Long, PharmD   [facility] Specialty Pharmacist

## 2022-04-06 DIAGNOSIS — I10 Essential (primary) hypertension: Principal | ICD-10-CM

## 2022-04-06 MED ORDER — LOSARTAN 50 MG TABLET
ORAL_TABLET | Freq: Every day | ORAL | 3 refills | 0 days
Start: 2022-04-06 — End: ?

## 2022-04-07 MED ORDER — LOSARTAN 50 MG TABLET
ORAL_TABLET | Freq: Every day | ORAL | 3 refills | 90 days
Start: 2022-04-07 — End: ?

## 2022-04-08 ENCOUNTER — Ambulatory Visit: Admit: 2022-04-08 | Discharge: 2022-04-09 | Payer: MEDICARE

## 2022-04-08 DIAGNOSIS — G629 Polyneuropathy, unspecified: Principal | ICD-10-CM

## 2022-04-08 DIAGNOSIS — G35 Multiple sclerosis: Principal | ICD-10-CM

## 2022-04-08 MED ORDER — GABAPENTIN 300 MG CAPSULE
ORAL_CAPSULE | Freq: Three times a day (TID) | ORAL | 11 refills | 34 days | Status: CP
Start: 2022-04-08 — End: 2023-04-08

## 2022-04-08 MED ORDER — DULOXETINE 30 MG CAPSULE,DELAYED RELEASE
ORAL_CAPSULE | Freq: Two times a day (BID) | ORAL | 3 refills | 90 days | Status: CP
Start: 2022-04-08 — End: 2023-04-03

## 2022-04-08 NOTE — Unmapped (Signed)
Outpatient Neurology Consult Note     MM[facility] [address]  [facility] NEUROLOGY [facility]INIC   [address]  [place] [address]  [phone]    Date: 04/08/2022  Patient Name: Catherine Campos  MRN: [id number]  PCP: Therese Sarah, MD     Assessment and Plan        Catherine Campos is a 67 y.o. female with a past medical history of COPD, HTN, HLD, and B12 deficiency presenting for follow up of BLLE paresthesias and RRMS.    #RRMS  Meets McDonald criteria with >=2 lesions (prior enhancing thoracic spinal cord lesions, newly discovered subcortical and periventricular white matter brain lesions) and >=2 clinical relapses of BLLE paresthesia starting in 2022.  MS mimickers were negative.  Symptoms stable.  Will plan to continue MS therapy and treat neuropathic type pain symptomatically.  Concern for weight gain and peripheral edema from pregabalin, will switch back to gabapentin.    Recommendations:  - Continue Kesimpta  - Continue duloxetine 30mg  BID   - Change Lyrica to Gabapentin 600mg  TID and extra 600mg  prn  - Trial OTC lidocaine cream and capsaicin cream prn  - Continue Vitamin D 50,000 IU per week for goal 40-60   - Repeat MRI brain, c-spine, t-spine w/wo in 6 months (~08/11/2022)  - Monitoring labs at pharmacy visit 04/2022:  CBC w/ diff, LFTs    Return Visit Discussed: Return in about 6 months (around 10/07/2022).     This patient was seen and discussed with Dr. Dina Rich who agrees with the above assessment and plan.     I personally spent 30 minutes face-to-face and non-face-to-face in the care of this patient, which includes all pre, intra, and post visit time on the date of service.  All documented time was specific to the E/M visit and does not include any procedures that may have been performed.     Prudy Feeler MD  Resident Physician PGY2  [facility] Department of Neurology            HPI         HPI 06/07/20: Catherine Campos is a 67 y.o. right handed female seen in consultation at the [facility] at the request of Dr. Judd Gaudier for evaluation of bilateral pedal numbness.  PMH COPD, HTN, HLD and B12 deficiency.    1 month history of bilateral foot numbness. First started on the right foot up to the ankles then it progressed to the left foot. She went to her PCP and was noted to have low B12 level and she underwent B12 shots for 4 weeks but symptoms did not improve. She is also currently taking Duloxetine 30mg  BID but with no improvement in her symptoms. She describes the sensation as heaviness, pounding and sand like feeling on both of them.  No burning sensation. No pain. No prior history. Retired, work previously as a Lawyer. No recent weight loss.   Glass of wine 2x/weekly.  No hx of DM.  Recent blood work at PCP on 04/2020 A1c 5.3, TSH 0.499(wnl) B12 173 but repleted.    Interval History 09/24/21:  16 month follow up for BL foot numbness.    -Spinal MRI 06/2020 consistent with subacute combined degeneration and degenerative disease.    -EMG completed 06/2021 without evidence of large fiber neuropathy.     -Labs from 07/2020:  B12 892, B1 nl, B6 nl, Homocysteine hi 20, MMA nl, Gliadin IgA high 25, Gliadin IgG nl,  SPEP nl, CRP nl, ESR nl, ENA nl, ANA positive 1:80, Copper nl.  Next bloodwork scheduled for Oct 2023.  -Pt has been compliant with B12, B6 and multivitamin  -Symptoms have gotten worse, with numbness to just below her knees, also gets a tight feeling in the same region.  Feels like she's walking on sponges or sand.  2-3 months ago started having days where her toes and feet hurt with throbbing or burning or pins and needles pain to the point she cannot put socks on.  Now needs to walk with a cane because she sometimes feels unsteady with walking.  No falls.  Able to feel temperature and pain.  - No issues with her hands  - Currently on Gabapentin 300mg  TID, pt can't tell if it's helping  - Endorses constipation, BM every other day.  Denies urinary issues.  - Denies vision changes.  Follows with ophtho.    Interval History 12/25/21:  MRI brain in Sept with multiple subcortical and periventricular white matter lesions; MRI spine now with non-enhancing lesions that were previously enhancing.      Clarifying history:  Symptoms started March 2022 with numbness in BL lower legs.  She had previously reported worsening of symptoms over time.  But she actually had relapsing remitting symptoms since onset in with weeks at normal baseline and then getting slammed with symptoms out of the blue lasting a few weeks.  Numbness has evolved over time to numbness and pain in BL lower legs.  Has had at least 3 relapses.    Blood-work returned:  ACE nl, IL-2 nl, AI myelopathy panel neg, B12 hi, B6 nl, B1 nl, Rf nl, APLS nl, folate nl, vitamin D low 19.5, vitamin E nl, RPR nl, Lyme nl, HIV nl, TSH nl    Interval History 04/07/22:  Started Kesimpta 01/21/22, tolerating well.  Continues on Duloxetine and Lyrica.  Sometimes has days where BLLE neuropathy is worse, usually worse during the day.      ROS:  weight gain (15 lbs in 6 months), fatigue.  No SOB.    Current symptoms:  Vision/double vision:  No change in vision, had annual ophthalmology eval for glasses  Speech, swallowing problems:  No  Weakness:  No  Fatigue:  Yes  Spasms:  No  Tingling/numbness/pain:  Feet are numb and painful.    Balance/coordination problems:  No  Bowel/bladder control problems:  Constipation, using miralax and senna.  No bladder issues.  Memory, mood:  No  Gait:  No  Falls:  no  Headaches:  No  Seizures:  No    COVID vaccine:  original shots and booster x1  Other vaccines:  yes, flu, PNA (due for second shingles)  Malignancy screening:  Has PCP, FIT DNA neg 01/2022, mammogram last 03/2021 (will reschedule 2024 appointment soon)   Vitamin D:  yes  Family planning:  post-menopausal     T25FW: 9 seconds  EDSS: 1    Allergies   Allergen Reactions    Codeine Hives and Swelling     Tongue swelling Penicillins Hives and Swelling     Has patient had a PCN reaction causing immediate rash, facial/tongue/throat swelling, SOB or lightheadedness with hypotension: Yes  Has patient had a PCN reaction causing severe rash involving mucus membranes or skin necrosis: No  Has patient had a PCN reaction that required hospitalization No  Has patient had a PCN reaction occurring within the last 10 years: Yes  If all of the above answers are NO,  then may proceed with Cephalosporin use.        Current Outpatient Medications   Medication Sig Dispense Refill    atorvastatin (LIPITOR) 10 MG tablet TAKE 1 TABLET BY MOUTH EVERY DAY 90 tablet 3    cyanocobalamin, vitamin B-12, (VITAMIN B-12 ORAL) Take by mouth.      DULoxetine (CYMBALTA) 30 MG capsule Take 30 mg in the morning for one week, then increase to 30 mg in the morning and 30 mg in the evening 60 capsule 4    empty container Misc Use as directed to dispose of injectable medications 1 each 3    ergocalciferol-1,250 mcg, 50,000 unit, (DRISDOL) 1,250 mcg (50,000 unit) capsule Take 1 capsule (1,250 mcg total) by mouth once a week. 4 capsule 11    losartan (COZAAR) 50 MG tablet Take 1 tablet (50 mg total) by mouth daily. 90 tablet 3    multivitamin (TAB-A-VITE/THERAGRAN) per tablet Take 1 tablet by mouth daily.      ofatumumab (KESIMPTA PEN) 20 mg/0.4 mL PnIj Inject contents of pens subcutaneously every 4 weeks 1.2 mL 0    pregabalin (LYRICA) 100 MG capsule Take 1 capsule (100 mg total) by mouth Three (3) times a day. 90 capsule 11    traZODone (DESYREL) 50 MG tablet Take 1 tablet (50 mg total) by mouth nightly. 90 tablet 1    albuterol HFA 90 mcg/actuation inhaler Inhale 2 puffs every six (6) hours as needed for wheezing or shortness of breath. 8 g 0    TURMERIC ROOT EXTRACT ORAL Take by mouth. (Patient not taking: Reported on 12/25/2021)       No current facility-administered medications for this visit.       Past Medical History:   Diagnosis Date    Asthma     COPD (chronic obstructive pulmonary disease) (CMS-HCC)     CVA (cerebral vascular accident) (CMS-HCC)     Multiple sclerosis (CMS-HCC)     Tobacco use        Past Surgical History:   Procedure Laterality Date    CESAREAN SECTION      HYSTERECTOMY      OOPHORECTOMY      TOTAL ABDOMINAL HYSTERECTOMY Bilateral 2003       Social History     Socioeconomic History    Marital status: Married     Spouse name: None    Number of children: None    Years of education: None    Highest education level: None   Tobacco Use    Smoking status: Former     Current packs/day: 0.00     Types: Cigarettes     Quit date: 10/12/2019     Years since quitting: 2.4    Smokeless tobacco: Never   Vaping Use    Vaping status: Never Used   Substance and Sexual Activity    Alcohol use: Yes     Comment: occassional    Drug use: Never    Sexual activity: Not Currently     Social Determinants of Health     Financial Resource Strain: Low Risk  (12/06/2019)    Overall Financial Resource Strain (CARDIA)     Difficulty of Paying Living Expenses: Not hard at all   Food Insecurity: No Food Insecurity (06/27/2021)    Hunger Vital Sign     Worried About Running Out of Food in the Last Year: Never true     Ran Out of Food in the Last Year: Never true   Transportation Needs:  No Transportation Needs (12/06/2019)    PRAPARE - Therapist, art (Medical): No     Lack of Transportation (Non-Medical): No       Family History   Problem Relation Age of Onset    Breast cancer Mother 35    Cancer Mother         BREAST CANCER        Review of Systems     A 10-system review of systems was conducted and was negative except as documented above in the HPI.       Objective        Vital signs: BP 136/61 (BP Site: L Arm, BP Position: Sitting, BP Cuff Size: Medium)  - Pulse 95  - Resp 16  - Wt 82.4 kg (181 lb 9.6 oz)  - BMI 36.68 kg/m??      Physical Exam:  General Appearance: well appearing. Normal skin color, afebrile.  Heart and lungs: Warm and well perfused. Normal work of breathing. Abdomen: Soft, non-tender.  Extremities:  mild edema BLLE    NEUROLOGICAL EXAMINATION:   General:  Alert and oriented to person, place, time and situation.    Recent and remote memory intact.    Attention span and concentration normal.    Language and spontaneous speech normal, no dysarthria or aphasia.   Fund of knowledge normal.    Cranial Nerves:   II, III- Pupils are equal and reactive to light b/l (direct and consensual reactions). No visual field defect.    III, IV, VI- extra ocular movements are intact, No ptosis, no diplopia, no nystagmus.  V- sensation of the face intact b/l.   VII- face symmetrical, no facial droop, normal facial movements with smile/grimace  VIII- Hearing grossly intact. Weber test: sound is symmetrical with no lateralization.  IX and X- symmetric palate contraction, no dysarthria, no dysphagia.  XI- Full shoulder shrug bilaterally; no wasting, normal tone and strength of sternocleidomastoid muscles bilaterally.  XII- No tongue atrophy, no tongue fasciculations; tongue protrudes midline, full range of movements of the tongue.    Neck: flexion normal.    Motor Exam:   Normal bulk. Fasciculations not observed.   Muscle strength:  RUE:  5/5 throughout  LUE:  5/5 throughout  RLE:  5/5 throughout  LLE:  5/5 throughout  McArdle sign negative.  Position test UE: no pronator drift b/l.     Reflexes R L   Biceps +2 brisk +2 brisk   Brachioradialis  +2 brisk +2brisk   Triceps +2brisk +2brisk   Patella +2 brisk +2brisk   Achilles +4 +4   1-2 beats clonus at ankles BL  Normal tone b/l. Negative Babinski and Hoffman sign bilaterally.    Sensory system:  Superficial light touch sensation: wnl bl UE/LE  Vibration sense: Mildly decreased sensation at toe on right, no sensation on left great toe.   Normal at ankles.  Position sense: wnl UE/LE  Pinprick test for pain sensation: wnl bl UE.  Diminished BLLE distally to mid shin.  Temperature:wnl UE/LE    Cerebellar/Coordination:  Rapid alternating movements, finger-to-nose and heel-to-shin  bilaterally demonstrate no abnormalities. No limb ataxia bilaterally. No gait ataxia. Romberg positive. Unable to perform tandem gait.    Gait:   Normal base, short stride, right foot turned out, slow unsteady turning.         Diagnostic Studies and Review of Records     EMG 06/28/21  Normal study. There is no electrodiagnostic evidence of  large fiber polyneuropathy. Small fiber neuropathy is not assessed by these techniques.     MRI T-spine w wo 06/25/2020  Spinal cord abnormal T6-7 with increased signal intensity in the dorsal columns on noncontrast sequence and foci of enhancement at T3-4 and T6-7. Recommend comparison with prior imaging in order to determine if this is the same location as prior abnormality felt to be related to B12 deficiency.     MRI C-spine w wo 06/25/2020  1. Faint, symmetric linear signal throughout the cervical spine suggestive of subacute combined degeneration/B12 deficiency. Please see also T-S report.   2. Multilevel neural foraminal narrowing, moderate and greatest bilaterally at C3-4 on the right and C5-C6.   3. Multilevel degenerative disc disease, moderate and greatest at C5-C6 with associated moderate central canal stenosis.     MRI CTL spine w wo 10/23/21  Similar appearance of T2 signal abnormality within the thoracic cord from T7 through T8. This is indeterminate in nature and could represent sequela of prior insult or demyelinating lesion.      Previous enhancing foci are not visualized in this study.       MRI brain w wo 10/23/21  Impression   Multiple bilateral subcortical and periventricular white matter foci of T2/FLAIR hyperintensity in a pattern consistent with multiple sclerosis.

## 2022-04-08 NOTE — Unmapped (Addendum)
You were seen in [facility] Neurology clinic today for MS.    The plan is:  - Continue Kesimpta  - Continue Duloxetine 30mg  BID   - Stop Lyrica  - Restart Gabapentin 900mg  TID and extra 600mg  as needed  - Try over the counter lidocaine cream and capsaicin cream (be careful with touching eyes afterwards) on feet as needed  - Continue Vitamin D 50,000 IU per week   - Repeat MRI's in June  - Pharmacy appointment in March with bloodwork    If you have any questions, the best way to contact me is by sending a MyChart message, but you can always call the office and leave a message for me at [phone].    Prudy Feeler MD  [facility] Neurology Resident

## 2022-04-10 NOTE — Unmapped (Signed)
[facility] Specialty Pharmacy Refill Coordination Note    Catherine Campos, DOB: 01/11/1956  Phone: [phone] (home)       All above HIPAA information was verified with patient.         04/09/2022     7:41 AM   Specialty Rx Medication Refill Questionnaire   Which Medications would you like refilled and shipped? Kesimpta   Please list all current allergies: Codine   Have you missed any doses in the last 30 days? No   Have you had any changes to your medication(s) since your last refill? No   How many days remaining of each medication do you have at home? 0   If receiving an injectable medication, next injection date is 04/22/2022   Have you experienced any side effects in the last 30 days? No   Please enter the full address (street address, city, state, zip code) where you would like your medication(s) to be delivered to. [address] [place] [address]   Please specify on which day you would like your medication(s) to arrive. Note: if you need your medication(s) within 3 days, please call the pharmacy to schedule your order at [phone]  04/14/2022   Has your insurance changed since your last refill? No   Would you like a pharmacist to call you to discuss your medication(s)? No   Do you require a signature for your package? (Note: if we are billing Medicare Part B or your order contains a controlled substance, we will require a signature) No         Completed refill call assessment today to schedule patient's medication shipment from the [facility] [phone]).  All relevant notes have been reviewed.       Confirmed patient received a Conservation officer, historic buildings and a Surveyor, mining with first shipment. The patient will receive a drug information handout for each medication shipped and additional FDA Medication Guides as required.         REFERRAL TO PHARMACIST     Referral to the pharmacist: Not needed      [facility]     Shipping address confirmed in Epic.     Delivery Scheduled: Yes, Expected medication delivery date: 04/14/22 .     Medication will be delivered via Same Day Courier to the prescription address in Epic WAM.    [name]   [facility] Specialty Technician

## 2022-04-14 MED FILL — KESIMPTA PEN 20 MG/0.4 ML SUBCUTANEOUS PEN INJECTOR: 28 days supply | Qty: 0.4 | Fill #1

## 2022-05-02 DIAGNOSIS — I10 Essential (primary) hypertension: Principal | ICD-10-CM

## 2022-05-02 DIAGNOSIS — F5101 Primary insomnia: Principal | ICD-10-CM

## 2022-05-02 MED ORDER — LOSARTAN 50 MG TABLET
ORAL_TABLET | Freq: Every day | ORAL | 3 refills | 90 days
Start: 2022-05-02 — End: ?

## 2022-05-02 MED ORDER — TRAZODONE 50 MG TABLET
ORAL_TABLET | Freq: Every evening | ORAL | 1 refills | 90 days | Status: CP
Start: 2022-05-02 — End: ?

## 2022-05-02 NOTE — Unmapped (Signed)
Patient is requesting the following refill  Requested Prescriptions     Pending Prescriptions Disp Refills    traZODone (DESYREL) 50 MG tablet [Pharmacy Med Name: TRAZODONE 50 MG TABLET] 90 tablet 1     Sig: TAKE 1 TABLET BY MOUTH EVERY DAY AT NIGHT     Refused Prescriptions Disp Refills    losartan (COZAAR) 50 MG tablet [Pharmacy Med Name: LOSARTAN POTASSIUM 50 MG TAB] 90 tablet 3     Sig: TAKE 1 TABLET BY MOUTH EVERY DAY     Refused ByBritta Mccreedy, [place]     Reason for Refusal: Patient has requested refill too soon       Recent Visits  Date Type Provider Dept   01/28/22 Office Visit Payton Mccallum, MD [facility] [address] [place]   11/27/21 Office Visit Payton Mccallum, MD [facility] [address] [place]   06/27/21 Office Visit Payton Mccallum, MD [facility] [address] [place]   Showing recent visits within past 365 days with a meds authorizing provider and meeting all other requirements  Future Appointments  Date Type Provider Dept   05/30/22 Appointment Payton Mccallum, MD [facility] [address] [place]   Showing future appointments within next 365 days with a meds authorizing provider and meeting all other requirements

## 2022-05-07 NOTE — Unmapped (Signed)
[facility] Special[facility] Pharmacy Refill Coordination Note    Specialty Medication(s) to be Shipped:   Neurology: Kesimpta    Other medication(s) to be shipped: No additional medications requested for fill at this time     Catherine Campos, DOB: 1955/10/17  Phone: [phone] (home)       All above HIPAA information was verified with patient.     Was a Nurse, learning disability used for this call? No    Completed refill call assessment today to schedule patient's medication shipment from the [facility] [phone]).  All relevant notes have been reviewed.     Specialty medication(s) and dose(s) confirmed: Regimen is correct and unchanged.   Changes to medications: Catherine Campos reports no changes at this time.  Changes to insurance: No  New side effects reported not previously addressed with a pharmacist or physician: None reported  Questions for the pharmacist: No    Confirmed patient received a Conservation officer, historic buildings and a Surveyor, mining with first shipment. The patient will receive a drug information handout for each medication shipped and additional FDA Medication Guides as required.       DISEASE/MEDICATION-SPECIFIC INFORMATION        For patients on injectable medications: Patient currently has 0 doses left.  Next injection is scheduled for 05/12/22 .    SPECIALTY MEDICATION ADHERENCE     Medication Adherence    Patient reported X missed doses in the last month: 0  Specialty Medication: KESIMPTA PEN 20 mg/0.4 mL  Patient is on additional specialty medications: No  Patient is on more than two specialty medications: No  Any gaps in refill history greater than 2 weeks in the last 3 months: no  Demonstrates understanding of importance of adherence: yes              Were doses missed due to medication being on hold? No     KESIMPTA PEN 20 mg/0.4 mL    : 0 days of medicine on hand        REFERRAL TO PHARMACIST     Referral to the pharmacist: Not needed      [facility]     Shipping address confirmed in Epic. Patient was notified of new phone menu : Yes    Delivery Scheduled: Yes, Expected medication delivery date: 05/09/22 .     Medication will be delivered via Same Day Courier to the prescription address in Epic WAM.    Catherine Campos   [facility] Specialty Technician

## 2022-05-09 MED FILL — KESIMPTA PEN 20 MG/0.4 ML SUBCUTANEOUS PEN INJECTOR: 28 days supply | Qty: 0.4 | Fill #2

## 2022-05-14 NOTE — Unmapped (Unsigned)
Medication: DMT Start for MS  DMT options include Ponvory, Kesimpta, Glatiramer, Vumerity, and Tecfidera. Pt expressed she would like the medication with the highest efficacy and less side effects as possible. Her other side effects concerns include fatigue and hindered mobility.   - Kesimpta,  - Pt states she is concerned about her immune system since she sees her grandkids often; I told patient about Kesimpta's immunosuppressant activity.   - open to injections since her daughter-in-law, Catherine Campos, is a hospice nurse and can help administer  - Pt also applying for Medicaid and was concerned about medication cost; I told her about free drug programs and financial assistance from manufacturers.  You were seen in [facility] clinic today for MS.     The plan is:  - Continue Kesimpta  - Continue Duloxetine 30mg  BID   - Stop Lyrica  - Restart Gabapentin 900mg  TID and extra 600mg  as needed  - Try over the counter lidocaine cream and capsaicin cream (be careful with touching eyes afterwards) on feet as needed  - Continue Vitamin D 50,000 IU per week   - Repeat MRI's in June  - Pharmacy appointment in March with bloodwork      SSC, last delivery 05/09/22 [facility]        [facility] Neurology Pharmacotherapy Clinic Summary      [address]  [facility] [address] [place]  [address] [place] [address]  [phone]    Date: 05/14/2022  Patient Name: Catherine Campos  MRN: [id number]  PCP: Payton Mccallum  Pcp, None Per Patient            Catherine Campos is a 67 y.o. female presenting for follow up at the [place] [facility]  2months after starting Kesimpta for multiple sclerosis. Last seen by  Dr. Lenise Arena  04/08/22.       Subjective:   Subjective        Obtained history from the patient who was {Blank:19197::alone,alone via video,accompanied by ***,***}      Interval history since last visit:   ***    [ ]  LABS today (LFT, CBC w diff, Ab IgG, );   - Repeat again in 3 mo + Abs    [ ]  Denies Systemic injection reaction (flu like, headache, headache, rigor, chills, low grade fever      Medication Access:  --Patient reports no*** issues with access to medications/cost    - Medication Reconciliation:   --Reviewed medication list with Catherine Campos and updated as necessary  --Drug-drug and drug-food interactions checked: {Blank:19197::yes, no interactions present,yes, interaction present***}  --Polypharmacy (patient on 5 or more medications/supplements): {Blank:19197::yes,no}   --*** discrepancies identified   -added:   -removed:   -updated:  --Catherine Campos is using *** pharmacy for non-specialty medications    Current Medications:  Current Outpatient Medications   Medication Sig Dispense Refill    albuterol HFA 90 mcg/actuation inhaler Inhale 2 puffs every six (6) hours as needed for wheezing or shortness of breath. 8 g 0    atorvastatin (LIPITOR) 10 MG tablet TAKE 1 TABLET BY MOUTH EVERY DAY 90 tablet 3    cyanocobalamin, vitamin B-12, (VITAMIN B-12 ORAL) Take by mouth.      DULoxetine (CYMBALTA) 30 MG capsule Take 1 capsule (30 mg total) by mouth two (2) times a day. 180 capsule 3    empty container Misc Use as directed to dispose of injectable medications 1  each 3    ergocalciferol-1,250 mcg, 50,000 unit, (DRISDOL) 1,250 mcg (50,000 unit) capsule Take 1 capsule (1,250 mcg total) by mouth once a week. 4 capsule 11    gabapentin (NEURONTIN) 300 MG capsule Take 2 capsules (600 mg total) by mouth Three (3) times a day. Can also take an extra 600mg  on days when pain is worse 200 capsule 11    losartan (COZAAR) 50 MG tablet Take 1 tablet (50 mg total) by mouth daily. 90 tablet 3    multivitamin (TAB-A-VITE/THERAGRAN) per tablet Take 1 tablet by mouth daily.      ofatumumab (KESIMPTA PEN) 20 mg/0.4 mL PnIj Inject the contents of 1 pen subcutaneously every 4 weeks. 1.2 mL 0    traZODone (DESYREL) 50 MG tablet TAKE 1 TABLET BY MOUTH EVERY DAY AT NIGHT 90 tablet 1     No current facility-administered medications for this visit.             Assessment & Plan:   Assessment     Catherine Campos is a 67 y.o. female seen in consultation for the following problems.     {No diagnosis found. (Refresh or delete this SmartLink)}  F/u mid July with Neurologist, after MRI          - Return to clinic in approximately {Blank:19197::one,three,six,***} months for follow-up with  {Blank:19197::***,Dr. Dujmovic,Dr. Arlana Lindau, PA,Dr. Era Skeen, MD,Dr. Mehrabyan,me}.     Catherine Campos verbalized understanding of all counseling points. Provided Neurology Pharmacy Team contacts and advised to please call should she have any further questions.     Medications reviewed in EPIC medication station and updated today by the clinical pharmacist practitioner.     I spent a total of {NUMBERS; 0-45 BY 5:10291} minutes {VISIT ZOXW:96045} with the patient delivering clinical care and providing education/counseling.

## 2022-05-26 DIAGNOSIS — I10 Essential (primary) hypertension: Principal | ICD-10-CM

## 2022-05-26 MED ORDER — LOSARTAN 50 MG TABLET
ORAL_TABLET | Freq: Every day | ORAL | 3 refills | 0 days
Start: 2022-05-26 — End: ?

## 2022-05-27 DIAGNOSIS — G35 Multiple sclerosis: Principal | ICD-10-CM

## 2022-05-27 MED ORDER — LOSARTAN 50 MG TABLET
ORAL_TABLET | Freq: Every day | ORAL | 3 refills | 90 days
Start: 2022-05-27 — End: ?

## 2022-05-27 MED ORDER — KESIMPTA PEN 20 MG/0.4 ML SUBCUTANEOUS PEN INJECTOR
0 refills | 0 days
Start: 2022-05-27 — End: ?

## 2022-05-28 MED ORDER — KESIMPTA PEN 20 MG/0.4 ML SUBCUTANEOUS PEN INJECTOR
2 refills | 0 days | Status: CP
Start: 2022-05-28 — End: ?
  Filled 2022-06-02: qty 0.4, 28d supply, fill #0

## 2022-05-28 NOTE — Unmapped (Signed)
Last Visit Date: 04/08/2022  Next Visit Date: Visit date not found  Last seen by [facility]  Lab Results   Component Value Date    Hep B Surface Ag Nonreactive 01/08/2022    Hep B Core Total Ab Nonreactive 01/08/2022    Hepatitis C Ab Nonreactive 01/08/2022    HIV Antigen/Antibody Combo Nonreactive 10/10/2021        No results found for this or any previous visit.      No results found for this or any previous visit.      No results found for this or any previous visit.

## 2022-05-28 NOTE — Unmapped (Signed)
[facility] Refill Coordination Note    Catherine Campos, DOB: Jan 05, 1956  Phone: [phone] (home)       All above HIPAA information was verified with patient.         05/27/2022     7:35 PM   Specialty Rx Medication Refill Questionnaire   Which Medications would you like refilled and shipped? Kesimpta Pen 20mg    If medication refills are not needed at this time, please indicate the reason below. No   Please list all current allergies: Codine/ Pencillen   Have you missed any doses in the last 30 days? No   Have you had any changes to your medication(s) since your last refill? No   How many days remaining of each medication do you have at home? 6   If receiving an injectable medication, next injection date is 06/10/2022   Have you experienced any side effects in the last 30 days? No   Please enter the full address (street address, city, state, zip code) where you would like your medication(s) to be delivered to. [address] [place] [address]   Please specify on which day you would like your medication(s) to arrive. Note: if you need your medication(s) within 3 days, please call the pharmacy to schedule your order at [phone]  06/02/2022   Has your insurance changed since your last refill? Yes   If YES, please enter your new insurance information below. United healthcare/ Medcare   Would you like a pharmacist to call you to discuss your medication(s)? No   Do you require a signature for your package? (Note: if we are billing Medicare Part B or your order contains a controlled substance, we will require a signature) No         Completed refill call assessment today to schedule patient's medication shipment from the [facility] [phone]).  All relevant notes have been reviewed.       Confirmed patient received a Conservation officer, historic buildings and a Surveyor, mining with first shipment. The patient will receive a drug information handout for each medication shipped and additional FDA Medication Guides as required.         REFERRAL TO PHARMACIST     Referral to the pharmacist: Not needed      [facility]     Shipping address confirmed in Epic.     Delivery Scheduled: Yes, Expected medication delivery date: 06/02/22 .     Medication will be delivered via Same Day Courier to the prescription address in Epic WAM.    [name]   [facility] Specialty Technician

## 2022-05-30 ENCOUNTER — Ambulatory Visit: Admit: 2022-05-30 | Discharge: 2022-05-31 | Payer: MEDICARE

## 2022-05-30 DIAGNOSIS — G629 Polyneuropathy, unspecified: Principal | ICD-10-CM

## 2022-05-30 DIAGNOSIS — F5101 Primary insomnia: Principal | ICD-10-CM

## 2022-05-30 DIAGNOSIS — I10 Essential (primary) hypertension: Principal | ICD-10-CM

## 2022-05-30 DIAGNOSIS — E785 Hyperlipidemia, unspecified: Principal | ICD-10-CM

## 2022-05-30 DIAGNOSIS — Z780 Asymptomatic menopausal state: Principal | ICD-10-CM

## 2022-05-30 MED ORDER — LOSARTAN 100 MG TABLET
ORAL_TABLET | Freq: Every day | ORAL | 3 refills | 90 days | Status: CP
Start: 2022-05-30 — End: 2023-05-30

## 2022-05-30 NOTE — Unmapped (Signed)
Assessment and Plan:     Hypertension, unspecified type  -     losartan; Take 1 tablet (100 mg total) by mouth daily.    Hyperlipidemia, unspecified hyperlipidemia type    Neuropathy    Postmenopausal  -     Dexa Bone Density Skeletal; Future    Primary insomnia      Multiple diagnoses above reviewed with patient  Blood pressure not at goal at triage today. Recommend patient increase dosage of losartan and continue monitoring at home. Reviewed risks of elevated blood pressure.  Reviewed benefits of DEXA screening with patient to scan for osteoporosis. Patient agreeable to this today.  Continue current medication management  INCREASE losartan 50 mg --> 100 mg daily for better blood pressure control. New Rx as above.  Rx as per orders; reviewed risks/benefits, possible side effects and patient verbalizes understanding  Referrals/labs as per orders above  DEXA ordered today  Recommend supportive treatment with lifestyle modifications with diet/exercise  Encouraged low-salt diet to reduce peripheral edema  Follow up in 6 months or sooner prn      Barriers to recommended plan: None identified        I personally spent 30 minutes face-to-face and non-face-to-face in the care of this patient, which includes all pre, intra, and post visit time (reviewing, evaluating and discussing pertinent records for patient's care) on the date of service.     Return in about 6 months (around 11/29/2022) for Recheck.    Subjective:     HPI: Catherine Campos is a 67 y.o. female here for follow up. Last seen by me 01/28/2022 for CDM. Patient with h/o dyslipidemia, hypertension, MS, neuropathy, insomnia. Patient doing ok today.     Hypertension - Patient with history of hypertension manages with losartan 50 mg daily. Blood pressure at triage today 146/76. Blood pressure at home running up and down per patient. Patient states she had a high blood pressure reading at home today. Patient reports she has been cooking more meals at home like fish. She admits to being a nibbler. Endorses trace peripheral edema.    Dyslipidemia - Patient taking atorvastatin 10 mg daily and tolerating well.     Neuropathy - Patient taking duloxetine 30 mg twice daily and gabapentin 900 mg three times daily. Patient no longer taking Lyrica 2/2 weight gain. She states she will have good days and bad days.    MS - Followed by neurology. Patient receives monthly injections.     Insomnia - Managed with trazodone 50 mg nightly. Well controlled on current regimen.        ROS:   Review of Systems     Review of systems negative unless otherwise noted as per HPI.      The following portions of the patient's history were reviewed and updated as appropriate: allergies, current medications, past family history, past medical history, past social history, past surgical history and problem list.     Objective:     Vitals:    05/30/22 1350   BP: 146/76   Pulse: 73   Temp: 36.4 ??C (97.5 ??F)     Body mass index is 36.52 kg/m?? (pended).    Physical Exam  Vitals and nursing note reviewed.   Constitutional:       General: She is not in acute distress.     Appearance: Normal appearance. She is not ill-appearing or toxic-appearing.   HENT:      Head: Normocephalic and atraumatic.   Cardiovascular:  Rate and Rhythm: Normal rate and regular rhythm.      Pulses: Normal pulses.      Heart sounds: Normal heart sounds.   Pulmonary:      Effort: Pulmonary effort is normal. No respiratory distress.      Breath sounds: Normal breath sounds. No stridor. No wheezing, rhonchi or rales.   Chest:      Chest wall: No tenderness.   Musculoskeletal:      Cervical back: Neck supple.      Right lower leg: Edema (trace) present.      Left lower leg: Edema (trace) present.   Neurological:      General: No focal deficit present.      Mental Status: She is alert and oriented to person, place, and time.          Allergies:     Codeine and Penicillins    PCMH:     Medication adherence and barriers to the treatment plan have been addressed. Opportunities to optimize healthy behaviors have been discussed. Patient / caregiver voiced understanding.      I attest that I, Zuling F Quade, personally documented this note while acting as scribe for Payton Mccallum, MD.      Ranee Gosselin, Scribe.  05/30/2022     The documentation recorded by the scribe accurately reflects the service I personally performed and the decisions made by me.    Payton Mccallum, MD

## 2022-06-23 ENCOUNTER — Ambulatory Visit: Admit: 2022-06-23 | Discharge: 2022-06-23 | Payer: MEDICARE

## 2022-06-23 NOTE — Unmapped (Signed)
[facility] Specialty Pharmacy Refill Coordination Note    Catherine Campos, DOB: 10-03-1955  Phone: [phone] (home)       All above HIPAA information was verified with patient.         06/20/2022     2:47 PM   Specialty Rx Medication Refill Questionnaire   Which Medications would you like refilled and shipped? Kesimpta Pen 20mg    Please list all current allergies: Codine/Pencillen   Have you missed any doses in the last 30 days? No   Have you had any changes to your medication(s) since your last refill? No   How many days remaining of each medication do you have at home? 9   If receiving an injectable medication, next injection date is 07/02/2022   Have you experienced any side effects in the last 30 days? No   Please enter the full address (street address, city, state, zip code) where you would like your medication(s) to be delivered to. [address] [place] [address]   Please specify on which day you would like your medication(s) to arrive. Note: if you need your medication(s) within 3 days, please call the pharmacy to schedule your order at [phone]  06/26/2022   Has your insurance changed since your last refill? No   Would you like a pharmacist to call you to discuss your medication(s)? No   Do you require a signature for your package? (Note: if we are billing Medicare Part B or your order contains a controlled substance, we will require a signature) No     Empty  sharps  container  also included      Completed refill call assessment today to schedule patient's medication shipment from the [facility] [phone]).  All relevant notes have been reviewed.       Confirmed patient received a Conservation officer, historic buildings and a Surveyor, mining with first shipment. The patient will receive a drug information handout for each medication shipped and additional FDA Medication Guides as required.         REFERRAL TO PHARMACIST     Referral to the pharmacist: Not needed      [facility] Shipping address confirmed in Epic.     Delivery Scheduled: Yes, Expected medication delivery date: 06/26/22 .     Medication will be delivered via Same Day Courier to the prescription address in Epic WAM.    [name]   [facility] Specialty Technician

## 2022-06-26 MED FILL — EMPTY CONTAINER: 120 days supply | Qty: 1 | Fill #1

## 2022-06-26 MED FILL — KESIMPTA PEN 20 MG/0.4 ML SUBCUTANEOUS PEN INJECTOR: 28 days supply | Qty: 0.4 | Fill #1

## 2022-06-28 DIAGNOSIS — F5101 Primary insomnia: Principal | ICD-10-CM

## 2022-06-28 MED ORDER — TRAZODONE 50 MG TABLET
ORAL_TABLET | Freq: Every evening | ORAL | 1 refills | 0 days
Start: 2022-06-28 — End: ?

## 2022-06-30 ENCOUNTER — Telehealth
Admit: 2022-06-30 | Discharge: 2022-07-01 | Attending: Pharmacist Clinician (PhC)/ Clinical Pharmacy Specialist | Primary: Pharmacist Clinician (PhC)/ Clinical Pharmacy Specialist

## 2022-06-30 DIAGNOSIS — G629 Polyneuropathy, unspecified: Principal | ICD-10-CM

## 2022-06-30 DIAGNOSIS — G35 Multiple sclerosis: Principal | ICD-10-CM

## 2022-06-30 DIAGNOSIS — Z5181 Encounter for therapeutic drug level monitoring: Principal | ICD-10-CM

## 2022-06-30 MED ORDER — GABAPENTIN 600 MG TABLET
ORAL_TABLET | 2 refills | 0 days | Status: CP
Start: 2022-06-30 — End: ?

## 2022-06-30 NOTE — Unmapped (Signed)
Patient is requesting the following refill  Requested Prescriptions     Pending Prescriptions Disp Refills    traZODone (DESYREL) 50 MG tablet [Pharmacy Med Name: TRAZODONE 50 MG TABLET] 90 tablet 1     Sig: TAKE 1 TABLET BY MOUTH EVERY DAY AT NIGHT       Recent Visits  Date Type Provider Dept   05/30/22 Office Visit Payton Mccallum, MD [facility] [address] [place]   01/28/22 Office Visit Payton Mccallum, MD [facility] [address] [facility]   11/27/21 Office Visit Payton Mccallum, MD [facility] [address] [place] [address] [place]   Showing recent visits within past 365 days with a meds authorizing provider and meeting all other requirements  Future Appointments  Date Type Provider Dept   12/02/22 Appointment Payton Mccallum, MD  Family Medicine [address] [place]   Showing future appointments within next 365 days with a meds authorizing provider and meeting all other requirements       Labs: Not applicable this refill

## 2022-06-30 NOTE — Unmapped (Signed)
[facility] Neurology Pharmacotherapy Clinic Summary      [address]  [facility]OLOGY CLINIC [address] [place]  [address]  [place] [address]  [phone]    Date: 06/30/2022  Patient Name: Catherine Campos  MRN: [id number]  PCP: Therese Sarah, MD            Ms. Catherine Campos is a 67 y.o. female presenting for follow up at the [place] [facility]  5months after starting Kesimpta for multiple sclerosis. Last seen by  Dr. Lenise Arena  in February 2024.       Subjective:   Subjective        Obtained history from the patient who was alone via video      Interval history since last visit:   Patient states she has been doing well since last visit with [place]. Denies falls or new symptoms. Has noticed slight worsening in neuropathy, usually only in the morning. Her feet feel more numb/heavy. She feels overall the gabapentin she was switched to is working slightly better than Lyrica, however would like to increase the dose if possible. She is also not having the swelling/edema that she had with Lyrica.     Denies side effects with Kesimpta, no other issues thus far.    Lastly, recently had a DEXA scan per PCP which showed osteoporosis. Has not discussed treatment with her PCP yet since this was so recent.      Medication Access:  --Patient reports no issues with access to medications/cost    - Medication Reconciliation:   --Reviewed medication list with Catherine Campos and updated as necessary  --Drug-drug and drug-food interactions checked: yes, no interactions present  --Polypharmacy (patient on 5 or more medications/supplements): yes   --0 discrepancies identified     --Social research officer, government is using [facility] pharmacy for non-specialty medications    Current Medications:  Current Outpatient Medications   Medication Sig Dispense Refill    albuterol HFA 90 mcg/actuation inhaler Inhale 2 puffs every six (6) hours as needed for wheezing or shortness of breath. 8 g 0    atorvastatin (LIPITOR) 10 MG tablet TAKE 1 TABLET BY MOUTH EVERY DAY 90 tablet 3    cyanocobalamin, vitamin B-12, (VITAMIN B-12 ORAL) Take by mouth.      DULoxetine (CYMBALTA) 30 MG capsule Take 1 capsule (30 mg total) by mouth two (2) times a day. 180 capsule 3    empty container Misc Use as directed to dispose of injectable medications 1 each 3    ergocalciferol-1,250 mcg, 50,000 unit, (DRISDOL) 1,250 mcg (50,000 unit) capsule Take 1 capsule (1,250 mcg total) by mouth once a week. 4 capsule 11    gabapentin (NEURONTIN) 600 MG tablet Take 2 capsules in the morning, 1 capsule in the afternoon and 1 capsule in the evening. Can take 1 extra capsule during the day if needed. 150 tablet 2    losartan (COZAAR) 100 MG tablet Take 1 tablet (100 mg total) by mouth daily. 90 tablet 3    losartan (COZAAR) 50 MG tablet Take 1 tablet (50 mg total) by mouth daily. 90 tablet 3    multivitamin (TAB-A-VITE/THERAGRAN) per tablet Take 1 tablet by mouth daily.      ofatumumab (KESIMPTA PEN) 20 mg/0.4 mL PnIj Inject the contents of 1 pen subcutaneously every 4 weeks. 1.2 mL 2    traZODone (DESYREL) 50 MG tablet TAKE 1 TABLET BY MOUTH EVERY DAY AT  NIGHT 90 tablet 1     No current facility-administered medications for this visit.             Assessment & Plan:   Assessment     Ms. Catherine Campos is a 67 y.o. female seen in consultation for the following problems.      Diagnosis ICD-10-CM Associated Orders   1. Multiple sclerosis (CMS-HCC)  G35 - Continue Kesimpta, 20 mg injection once every 4 weeks  - Started in December 2023  -monitoring: see below  -adherence/compliance: patient reports no missed doses in the last month. Patient confirms storing Kesimpta in the fridge, lets it come to room temp for 30 minutes prior to injection, confirms rotating sites. Currently uses the back of her arms to inject and her daughter who is an Charity fundraiser helps her do this.   -drug interactions: assessed for drug interactions  -safety/tolerability: Patient does not report any side effects or adverse drug reactions to medication. Is taking either Benadryl or Tylenol 30 min prior to each injection. Discussed today that she can stop taking this.   -Confirmed DMT refill not needed today; no refills sent today              --Catherine Campos is using [facility] for specialty pharmacy       2. Encounter for medication monitoring  Z51.81 Safety lab work for [employer] ordered today. Asked patient to have these done this week:  CBC w/ Differential     IgG     IgM      3. Peripheral polyneuropathy  G62.9 - Currently taking Cymbalta 30 mg BID and gabapentin 300 mg, 2 capsules in AM, 2 capsules in afternoon, and 2 capsules in PM with extra dose if needed.  - based on increase in numbness and heaviness in AM, would like to try to achieve better pain control if possible.  - would NOT increase dose of Cymbalta  - instructed patient to increase gabapentin to the following:  1200 mg AM  600 mg afternoon  600 mg evening  Can take extra 600 mg once daily if needed.   - advised I would not suggest she increase gabapentin further than this and another neuropathic pain agent would need to be added if additional pain control is needed  - also discussed these agents work the best for tingling/burning so may have limited efficacy with numbness/heaviness.           - Return to clinic in approximately three months for follow-up with   Dr. Lenise Arena . Recommend MRIs be done in June since this will be 6 months since starting Kesimpta.      Catherine Campos verbalized understanding of all counseling points. Provided Neurology Pharmacy Team contacts and advised to please call should she have any further questions.     Medications reviewed in EPIC medication station and updated today by the clinical pharmacist practitioner.     I spent a total of  21  minutes on audio-video with the patient delivering clinical care and providing education/counseling. Worthy Flank, PharmD, CPP  Clinical Pharmacist, [facility]  Phone: [phone]      The patient reports they are physically located in [place] and is currently: at home. I conducted a audio/video visit. I spent  11m 47s on the video call with the patient. I spent an additional 5 minutes on pre- and post-visit activities on the date of service .

## 2022-06-30 NOTE — Unmapped (Signed)
Continue Kesimpta, 1 pen every 4 weeks  Please go to [facility] for lab draw this week - we need to check your CBC w/ diff, IgM and IgG  You can increase your gabapentin to add 600 mg in the morning to your current regimen. So your updated dose will be:  1200 mg AM  600 mg afternoon  600 mg PM  Extra 600 mg once daily as needed    If this dose change makes you too sleepy during the day or foggy thinking, please go back to your previous dose. I will send an updated prescription for the gabapentin for 600 mg capsules to reduce the number of pills you are taking    4. Recommend MRI be done mid to late June  5. Follow up with Dr. Lenise Arena in August or sooner if needed.

## 2022-07-01 MED ORDER — TRAZODONE 50 MG TABLET
ORAL_TABLET | Freq: Every evening | ORAL | 1 refills | 90 days | Status: CP
Start: 2022-07-01 — End: ?

## 2022-07-14 NOTE — Unmapped (Signed)
[facility] Refill Coordination Note    Catherine Campos, DOB: 07-11-1955  Phone: [phone] (home)       All above HIPAA information was verified with patient.         07/14/2022    10:01 AM   Specialty Rx Medication Refill Questionnaire   Which Medications would you like refilled and shipped? Kesimpta Pen 20mg /0.4   Please list all current allergies: Codeine/ Pencilnen   Have you missed any doses in the last 30 days? No   Have you had any changes to your medication(s) since your last refill? No   How many days remaining of each medication do you have at home? 15   If receiving an injectable medication, next injection date is 07/29/2022   Have you experienced any side effects in the last 30 days? No   Please enter the full address (street address, city, state, zip code) where you would like your medication(s) to be delivered to. [address]  [place]   Please specify on which day you would like your medication(s) to arrive. Note: if you need your medication(s) within 3 days, please call the pharmacy to schedule your order at [phone]  07/25/2022   Has your insurance changed since your last refill? No   Would you like a pharmacist to call you to discuss your medication(s)? No   Do you require a signature for your package? (Note: if we are billing Medicare Part B or your order contains a controlled substance, we will require a signature) No         Completed refill call assessment today to schedule patient's medication shipment from the [facility] [phone]).  All relevant notes have been reviewed.       Confirmed patient received a Conservation officer, historic buildings and a Surveyor, mining with first shipment. The patient will receive a drug information handout for each medication shipped and additional FDA Medication Guides as required.         REFERRAL TO PHARMACIST     Referral to the pharmacist: Not needed      [facility]     Shipping address confirmed in Epic. Delivery Scheduled: Yes, Expected medication delivery date: 07/25/2022.     Medication will be delivered via Same Day Courier to the prescription address in Epic WAM.    Dorisann Frames   [facility] Specialty Technician

## 2022-07-25 MED FILL — KESIMPTA PEN 20 MG/0.4 ML SUBCUTANEOUS PEN INJECTOR: 28 days supply | Qty: 0.4 | Fill #2

## 2022-07-29 NOTE — Unmapped (Signed)
Abstraction Result Flowsheet Data    This patient's last AWV date: [facility] Last Medicare Wellness Visit Date: Not Found  This patients last WCC/CPE date: : 12/28/2020      Reason for Encounter  Reason for Encounter: Outreach  Primary Reason for Outreach: AWV  Text Message: No  MyChart Message: No  Outreach Call Outcome: WTM/Shared Visit cannot be made due to CM/PCP scheduling conflict

## 2022-08-15 NOTE — Unmapped (Signed)
[facility] Clinical Assessment & Refill Coordination Note    Catherine Campos, DOB: 31-Mar-1955  Phone: [phone] (home)     All above HIPAA information was verified with patient.     Was a Nurse, learning disability used for this call? No    Specialty Medication(s):   Neurology: Kesimpta     Current Outpatient Medications   Medication Sig Dispense Refill    albuterol HFA 90 mcg/actuation inhaler Inhale 2 puffs every six (6) hours as needed for wheezing or shortness of breath. 8 g 0    atorvastatin (LIPITOR) 10 MG tablet TAKE 1 TABLET BY MOUTH EVERY DAY 90 tablet 3    cyanocobalamin, vitamin B-12, (VITAMIN B-12 ORAL) Take by mouth.      DULoxetine (CYMBALTA) 30 MG capsule Take 1 capsule (30 mg total) by mouth two (2) times a day. 180 capsule 3    empty container Misc Use as directed to dispose of injectable medications 1 each 3    ergocalciferol-1,250 mcg, 50,000 unit, (DRISDOL) 1,250 mcg (50,000 unit) capsule Take 1 capsule (1,250 mcg total) by mouth once a week. 4 capsule 11    gabapentin (NEURONTIN) 600 MG tablet Take 2 capsules in the morning, 1 capsule in the afternoon and 1 capsule in the evening. Can take 1 extra capsule during the day if needed. 150 tablet 2    losartan (COZAAR) 100 MG tablet Take 1 tablet (100 mg total) by mouth daily. 90 tablet 3    losartan (COZAAR) 50 MG tablet Take 1 tablet (50 mg total) by mouth daily. 90 tablet 3    multivitamin (TAB-A-VITE/THERAGRAN) per tablet Take 1 tablet by mouth daily.      ofatumumab (KESIMPTA PEN) 20 mg/0.4 mL PnIj Inject the contents of 1 pen subcutaneously every 4 weeks. 1.2 mL 2    traZODone (DESYREL) 50 MG tablet TAKE 1 TABLET BY MOUTH EVERY DAY AT NIGHT 90 tablet 1     No current facility-administered medications for this visit.        Changes to medications: Alona reports no changes at this time.    Allergies   Allergen Reactions    Codeine Hives and Swelling     Tongue swelling    Penicillins Hives and Swelling     Has patient had a PCN reaction causing immediate rash, facial/tongue/throat swelling, SOB or lightheadedness with hypotension: Yes  Has patient had a PCN reaction causing severe rash involving mucus membranes or skin necrosis: No  Has patient had a PCN reaction that required hospitalization No  Has patient had a PCN reaction occurring within the last 10 years: Yes  If all of the above answers are NO, then may proceed with Cephalosporin use.       Changes to allergies: No    SPECIALTY MEDICATION ADHERENCE     Kesimpta 20  mg/0.47ml : 0 days of medicine on hand       Medication Adherence    Patient reported X missed doses in the last month: 0  Specialty Medication: Kesimpta  Patient is on additional specialty medications: No  Informant: patient          Specialty medication(s) dose(s) confirmed: Regimen is correct and unchanged.     Are there any concerns with adherence? No    Adherence counseling provided? Not needed    CLINICAL MANAGEMENT AND INTERVENTION      Clinical Benefit Assessment:    Do you feel the medicine is effective or helping your condition? Yes  Clinical Benefit counseling provided? Not needed    Adverse Effects Assessment:    Are you experiencing any side effects? No    Are you experiencing difficulty administering your medicine? No    Quality of Life Assessment:    Quality of Life    Rheumatology  Oncology  Dermatology  Cystic Fibrosis          How many days over the past month did your MS  keep you from your normal activities? For example, brushing your teeth or getting up in the morning. 0    Have you discussed this with your provider? Not needed    Acute Infection Status:    Acute infections noted within Epic:  No active infections  Patient reported infection: None    Therapy Appropriateness:    Is therapy appropriate and patient progressing towards therapeutic goals? Yes, therapy is appropriate and should be continued    DISEASE/MEDICATION-SPECIFIC INFORMATION      For patients on injectable medications: Patient currently has 0 doses left.  Next injection is scheduled for 08/24/22.    Multiple Sclerosis: Have you experienced any flares in the last month? Yes, patient had a flare about 2 weeks ago, has since resolved.  Has this been reported to your provider? Yes, patient has appt scheduled for 7/19  What was the outcome of the flare? Not applicable    PATIENT SPECIFIC NEEDS     Does the patient have any physical, cognitive, or cultural barriers? No    Is the patient high risk? No    Did the patient require a clinical intervention? No    Does the patient require physician intervention or other additional services (i.e., nutrition, smoking cessation, social work)? No    SOCIAL DETERMINANTS OF HEALTH     At the [facility], we have learned that life circumstances - like trouble affording food, housing, utilities, or transportation can affect the health of many of our patients.   That is why we wanted to ask: are you currently experiencing any life circumstances that are negatively impacting your health and/or quality of life? Patient declined to answer    Social Determinants of Health     Financial Resource Strain: Low Risk  (12/06/2019)    Overall Financial Resource Strain (CARDIA)     Difficulty of Paying Living Expenses: Not hard at all   Internet Connectivity: Not on file   Food Insecurity: No Food Insecurity (05/30/2022)    Hunger Vital Sign     Worried About Running Out of Food in the Last Year: Never true     Ran Out of Food in the Last Year: Never true   Tobacco Use: Medium Risk (05/30/2022)    Patient History     Smoking Tobacco Use: Former     Smokeless Tobacco Use: Never     Passive Exposure: Not on file   Housing/Utilities: Low Risk  (12/06/2019)    Housing/Utilities     Within the past 12 months, have you ever stayed: outside, in a car, in a tent, in an overnight shelter, or temporarily in someone else's home (i.e. couch-surfing)?: No     Are you worried about losing your housing?: No     Within the past 12 months, have you been unable to get utilities (heat, electricity) when it was really needed?: No   Alcohol Use: Not on file   Transportation Needs: No Transportation Needs (12/06/2019)    PRAPARE - Transportation     Lack of Transportation (  Medical): No     Lack of Transportation (Non-Medical): No   Substance Use: Not on file   Health Literacy: Low Risk  (12/28/2020)    Health Literacy     : Never   Physical Activity: Not on file   Interpersonal Safety: Not on file   Stress: Not on file   Intimate Partner Violence: Not on file   Depression: Not at risk (11/27/2021)    PHQ-2     PHQ-2 Score: 0   Social Connections: Not on file       Would you be willing to receive help with any of the needs that you have identified today? Not applicable       SHIPPING     Specialty Medication(s) to be Shipped:   Neurology: Kesimpta    Other medication(s) to be shipped: No additional medications requested for fill at this time     Changes to insurance: No    Delivery Scheduled: Yes, Expected medication delivery date: 08/21/22.     Medication will be delivered via Same Day Courier to the confirmed prescription address in [facility].    The patient will receive a drug information handout for each medication shipped and additional FDA Medication Guides as required.  Verified that patient has previously received a Conservation officer, historic buildings and a Surveyor, mining.    The patient or caregiver noted above participated in the development of this care plan and knows that they can request review of or adjustments to the care plan at any time.      All of the patient's questions and concerns have been addressed.    Arnold Long, PharmD   [facility] Specialty Pharmacist

## 2022-08-18 ENCOUNTER — Ambulatory Visit: Admit: 2022-08-18 | Discharge: 2022-08-18 | Payer: MEDICARE

## 2022-08-18 MED ADMIN — gadoterate meglumine (DOTAREM) Soln 15 mL: 15 mL | INTRAVENOUS | @ 17:00:00 | Stop: 2022-08-18

## 2022-08-21 MED FILL — KESIMPTA PEN 20 MG/0.4 ML SUBCUTANEOUS PEN INJECTOR: 28 days supply | Qty: 0.4 | Fill #3

## 2022-08-29 ENCOUNTER — Ambulatory Visit: Payer: MEDICARE

## 2022-08-29 DIAGNOSIS — G35 Multiple sclerosis: Principal | ICD-10-CM

## 2022-08-29 DIAGNOSIS — E559 Vitamin D deficiency, unspecified: Principal | ICD-10-CM

## 2022-08-29 NOTE — Unmapped (Addendum)
You were seen in [facility] Neurology clinic today for MS.    The plan is:  - Continue Kesimpta  - Continue duloxetine 30mg  BID   - Continue Gabapentin 1200/600/600 with extra 600mg  prn once daily  - Continue Vitamin D 50,000 IU per week for goal 40-60   - Repeat MRI brain, c-spine, t-spine w/wo in 12 months (~08/2023)  - Labs at [facility]:  CBC w/ diff, IgM, IgG, vitamin D    If you have any questions, the best way to contact me is by sending a MyChart message, but you can always call the office and leave a message for me at [phone].    Prudy Feeler MD  [facility] Neurology Resident

## 2022-08-29 NOTE — Unmapped (Signed)
Outpatient Neurology Consult Note     [facility] [address]  [facility]   [address]  [place] [address]  [phone]    Date: 08/29/2022  Patient Name: Catherine Campos  MRN: [id number]  PCP: Therese Sarah, MD     Assessment and Plan        Catherine Campos is a 67 y.o. female with a past medical history of COPD, HTN, HLD, and B12 deficiency presenting for follow up of BLLE paresthesias and RRMS.    #RRMS  Met McDonald criteria with >=2 lesions (prior enhancing thoracic spinal cord lesions, newly discovered subcortical and periventricular white matter brain lesions) and >=2 clinical relapses of BLLE paresthesia starting in 2022, however first possible episode was in 2018 with dizziness, right-sided weakness and numbness and blurred vision for 2-3 days.  MS mimickers were negative (serum testing).  Symptoms stable and minimal.  Surveillance MRI neuro-axis this month stable without new or enhancing lesions.  Plan to continue MS therapy and treat neuropathic type pain symptomatically.      Recommendations:  - Continue Kesimpta monthly injections  - Continue duloxetine 30mg  BID and gabapentin 1200/600/600 with extra 600mg  prn once daily  - Continue Vitamin D 50,000 IU per week for goal 40-60   - Repeat MRI brain, c-spine, t-spine w/wo in 12 months (~08/2023)  - Labs at [facility]:  CBC w/ diff, IgM, IgG, vitamin D  - Requesting powershare of 2018 MRI brain wo from [facility]    Return Visit Discussed: Return in about 6 months (around 03/01/2023).      This patient was discussed with Dr. Hope Budds who agrees with the above assessment and plan.     I personally spent 30 minutes face-to-face and non-face-to-face in the care of this patient, which includes all pre, intra, and post visit time on the date of service.  All documented time was specific to the E/M visit and does not include any procedures that may have been performed.     Prudy Feeler MD  Resident Physician PGY3  [facility] Department of Neurology            HPI         HPI 06/07/20: Catherine Campos is a 67 y.o. right handed female seen in consultation at the [facility] at the request of Dr. Judd Gaudier for evaluation of bilateral pedal numbness.  PMH COPD, HTN, HLD and B12 deficiency.    1 month history of bilateral foot numbness. First started on the right foot up to the ankles then it progressed to the left foot. She went to her PCP and was noted to have low B12 level and she underwent B12 shots for 4 weeks but symptoms did not improve. She is also currently taking Duloxetine 30mg  BID but with no improvement in her symptoms. She describes the sensation as heaviness, pounding and sand like feeling on both of them.  No burning sensation. No pain. No prior history. Retired, work previously as a Lawyer. No recent weight loss.   Glass of wine 2x/weekly.  No hx of DM.  Recent blood work at PCP on 04/2020 A1c 5.3, TSH 0.499(wnl) B12 173 but repleted.    Interval History 09/24/21:  16 month follow up for BL foot numbness.    -Spinal MRI 06/2020 consistent with subacute combined degeneration and degenerative disease.    -EMG completed 06/2021 without evidence of large fiber neuropathy.     -Labs from  07/2020:  B12 892, B1 nl, B6 nl, Homocysteine hi 20, MMA nl, Gliadin IgA high 25, Gliadin IgG nl, SPEP nl, CRP nl, ESR nl, ENA nl, ANA positive 1:80, Copper nl.  Next bloodwork scheduled for Oct 2023.  -Pt has been compliant with B12, B6 and multivitamin  -Symptoms have gotten worse, with numbness to just below her knees, also gets a tight feeling in the same region.  Feels like she's walking on sponges or sand.  2-3 months ago started having days where her toes and feet hurt with throbbing or burning or pins and needles pain to the point she cannot put socks on.  Now needs to walk with a cane because she sometimes feels unsteady with walking.  No falls.  Able to feel temperature and pain.  - No issues with her hands  - Currently on Gabapentin 300mg  TID, pt can't tell if it's helping  - Endorses constipation, BM every other day.  Denies urinary issues.  - Denies vision changes.  Follows with ophtho.    Interval History 12/25/21:  MRI brain in Sept with multiple subcortical and periventricular white matter lesions; MRI spine now with non-enhancing lesions that were previously enhancing.      Clarifying history:  Symptoms started March 2022 with numbness in BL lower legs.  She had previously reported worsening of symptoms over time.  But she actually had relapsing remitting symptoms since onset in with weeks at normal baseline and then getting slammed with symptoms out of the blue lasting a few weeks.  Numbness has evolved over time to numbness and pain in BL lower legs.  Has had at least 3 relapses.    Blood-work returned:  ACE nl, IL-2 nl, AI myelopathy panel neg, B12 hi, B6 nl, B1 nl, Rf nl, APLS nl, folate nl, vitamin D low 19.5, vitamin E nl, RPR nl, Lyme nl, HIV nl, TSH nl    Interval History 04/07/22:  Started Kesimpta 01/21/22, tolerating well.  Continues on Duloxetine and Lyrica.  Sometimes has days where BLLE neuropathy is worse, usually worse during the day.      ROS:  weight gain (15 lbs in 6 months), fatigue.  No SOB.    Current symptoms:  Vision/double vision:  No change in vision, had annual ophthalmology eval for glasses  Speech, swallowing problems:  No  Weakness:  No  Fatigue:  Yes  Spasms:  No  Tingling/numbness/pain:  Feet are numb and painful.    Balance/coordination problems:  No  Bowel/bladder control problems:  Constipation, using miralax and senna.  No bladder issues.  Memory, mood:  No  Gait:  No  Falls:  no  Headaches:  No  Seizures:  No    COVID vaccine:  original shots and booster x1  Other vaccines:  yes, flu, PNA (due for second shingles)  Malignancy screening:  Has PCP, FIT DNA neg 01/2022, mammogram last 03/2021 (will reschedule 2024 appointment soon)   Vitamin D:  yes  Family planning: post-menopausal     T25FW: 9 seconds  EDSS: 1    Interval History 04/07/22:    Overall patient is doing well.  Started Kesimpta 01/21/22, continues monthly injections, tolerating well.  Had pharmacy appointment on 06/30/22, monitoring labs ordered.  MRI neuro-axis this month stable.      Per chart review, found episode in 05/2016 with dizziness, right-sided weakness and numbness and blurred vision for 2-3 days.  Patient recalls this event and says she was diagnosed with a TIA.  Symptoms never  recurred.       Current symptoms:  Vision/double vision:  Had recent annual ophthalmology eval with dilation recently for glasses, got new prescription  Speech, swallowing problems:  No  Weakness:  No  Fatigue:  No  Spasms:  No  Tingling/numbness/pain:  Continues to complain of numbness and heaviness in both legs, worse in the mornings.  Continues on Duloxetine 30mg  BID and Gabapentin 1200/600/600 with decent control of painful neuropathy.   Balance/coordination problems:  No.    Gait:  No   Falls:  Had one fall a few weeks ago standing from seated position because leg gave out.  Bowel/bladder control problems:  Constipation, using miralax and senna.  No bladder issues.  Memory, mood:  No  Headaches:  No  Seizures:  No  COVID vaccine:  original shots and booster x1  Other vaccines:  flu, PNA, due for second shingles  Malignancy screening:  Has PCP, FIT DNA neg 01/2022, mammogram last 03/2021 (will schedule 2024 appointment soon)   Vitamin D:  yes, weekly  Family planning:  post-menopausal     EDSS: 1    Allergies   Allergen Reactions    Codeine Hives and Swelling     Tongue swelling    Penicillins Hives and Swelling     Has patient had a PCN reaction causing immediate rash, facial/tongue/throat swelling, SOB or lightheadedness with hypotension: Yes  Has patient had a PCN reaction causing severe rash involving mucus membranes or skin necrosis: No  Has patient had a PCN reaction that required hospitalization No  Has patient had a PCN reaction occurring within the last 10 years: Yes  If all of the above answers are NO, then may proceed with Cephalosporin use.        Current Outpatient Medications   Medication Sig Dispense Refill    albuterol HFA 90 mcg/actuation inhaler Inhale 2 puffs every six (6) hours as needed for wheezing or shortness of breath. 8 g 0    atorvastatin (LIPITOR) 10 MG tablet TAKE 1 TABLET BY MOUTH EVERY DAY 90 tablet 3    cyanocobalamin, vitamin B-12, (VITAMIN B-12 ORAL) Take by mouth.      DULoxetine (CYMBALTA) 30 MG capsule Take 1 capsule (30 mg total) by mouth two (2) times a day. 180 capsule 3    empty container Misc Use as directed to dispose of injectable medications 1 each 3    ergocalciferol-1,250 mcg, 50,000 unit, (DRISDOL) 1,250 mcg (50,000 unit) capsule Take 1 capsule (1,250 mcg total) by mouth once a week. 4 capsule 11    gabapentin (NEURONTIN) 600 MG tablet Take 2 capsules in the morning, 1 capsule in the afternoon and 1 capsule in the evening. Can take 1 extra capsule during the day if needed. (Patient taking differently: Take 2 capsules three times daily. Can take 1 extra capsule during the day if needed.) 150 tablet 2    losartan (COZAAR) 100 MG tablet Take 1 tablet (100 mg total) by mouth daily. 90 tablet 3    losartan (COZAAR) 50 MG tablet Take 1 tablet (50 mg total) by mouth daily. 90 tablet 3    multivitamin (TAB-A-VITE/THERAGRAN) per tablet Take 1 tablet by mouth daily.      ofatumumab (KESIMPTA PEN) 20 mg/0.4 mL PnIj Inject the contents of 1 pen subcutaneously every 4 weeks. 1.2 mL 2    traZODone (DESYREL) 50 MG tablet TAKE 1 TABLET BY MOUTH EVERY DAY AT NIGHT 90 tablet 1     No current facility-administered medications  for this visit.       Past Medical History:   Diagnosis Date    Asthma     COPD (chronic obstructive pulmonary disease) (CMS-HCC)     CVA (cerebral vascular accident) (CMS-HCC)     Multiple sclerosis (CMS-HCC)     Tobacco use        Past Surgical History:   Procedure Laterality Date CESAREAN SECTION      HYSTERECTOMY      OOPHORECTOMY      TOTAL ABDOMINAL HYSTERECTOMY Bilateral 2003       Social History     Socioeconomic History    Marital status: Widowed     Spouse name: None    Number of children: None    Years of education: None    Highest education level: None   Tobacco Use    Smoking status: Former     Current packs/day: 0.00     Types: Cigarettes     Quit date: 10/12/2019     Years since quitting: 2.8     Passive exposure: Never    Smokeless tobacco: Never   Vaping Use    Vaping status: Never Used   Substance and Sexual Activity    Alcohol use: Yes     Comment: occassional    Drug use: Never    Sexual activity: Not Currently     Social Determinants of Health     Financial Resource Strain: Low Risk  (12/06/2019)    Overall Financial Resource Strain (CARDIA)     Difficulty of Paying Living Expenses: Not hard at all   Food Insecurity: No Food Insecurity (05/30/2022)    Hunger Vital Sign     Worried About Running Out of Food in the Last Year: Never true     Ran Out of Food in the Last Year: Never true   Transportation Needs: No Transportation Needs (12/06/2019)    PRAPARE - Therapist, art (Medical): No     Lack of Transportation (Non-Medical): No       Family History   Problem Relation Age of Onset    Breast cancer Mother 72    Cancer Mother         BREAST CANCER        Review of Systems     A 10-system review of systems was conducted and was negative except as documented above in the HPI.       Objective        Vital signs: BP 156/76 (BP Site: L Arm, BP Position: Sitting, BP Cuff Size: Medium) Comment (BP Cuff Size): long - Pulse 81  - Ht 149.9 cm (4' 11)  - Wt 81.2 kg (179 lb)  - BMI 36.15 kg/m??      Physical Exam:  General Appearance: well appearing. Normal skin color, afebrile.  Heart and lungs: Warm and well perfused. Normal work of breathing. Abdomen: Soft, non-tender.  Extremities:  2+ edema RLE to thigh    NEUROLOGICAL EXAMINATION:   General:  Alert and oriented to person, place, time and situation.    Recent and remote memory intact.    Attention span and concentration normal.    Language and spontaneous speech normal, no dysarthria or aphasia.   Fund of knowledge normal.    Cranial Nerves:   II, III- Pupils are equal and reactive to light b/l (direct and consensual reactions). No visual field defect.    III, IV, VI- extra ocular movements are intact, No  ptosis, no diplopia, no nystagmus.  V- sensation of the face intact b/l.   VII- face symmetrical, no facial droop, normal facial movements with smile/grimace  VIII- Hearing grossly intact. Weber test: sound is symmetrical with no lateralization.  IX and X- symmetric palate contraction, no dysarthria, no dysphagia.  XI- Full shoulder shrug bilaterally; no wasting, normal tone and strength of sternocleidomastoid muscles bilaterally.  XII- No tongue atrophy, no tongue fasciculations; tongue protrudes midline, full range of movements of the tongue.    Neck: flexion normal.    Motor Exam:   Normal bulk. Fasciculations not observed.   Muscle strength:  RUE:  5/5 throughout  LUE:  5/5 throughout  RLE:  5/5 throughout  LLE:  5/5 throughout  McArdle sign negative.  Position test UE: no pronator drift b/l.     Reflexes R L   Biceps +2 brisk +2 brisk   Brachioradialis  +2 brisk +2brisk   Triceps +2brisk +2brisk   Patella +2 brisk +2brisk   Achilles +4 +4   1-2 beats clonus at ankles BL  Normal tone b/l. Negative Babinski and Hoffman sign bilaterally.    Sensory system:  Superficial light touch sensation: wnl bl UE/LE  Vibration sense: Normal  Position sense: wnl UE/LE  Pinprick test for pain sensation: previously:  wnl bl UE.  Diminished BLLE distally to mid shin.  Temperature:wnl UE/LE    Cerebellar/Coordination:  Rapid alternating movements, finger-to-nose and heel-to-shin  bilaterally demonstrate no abnormalities. No limb ataxia bilaterally. No gait ataxia. Romberg positive. Unable to perform tandem gait.    Gait:   Wide base, short stride, right foot turned out, slow unsteady turning.         Diagnostic Studies and Review of Records     EMG 06/28/21  Normal study. There is no electrodiagnostic evidence of large fiber polyneuropathy. Small fiber neuropathy is not assessed by these techniques.     MRI T-spine and c-spine w wo 06/25/2020  Spinal cord abnormal T6-7 with increased signal intensity in the dorsal columns on noncontrast sequence and foci of enhancement at T3-4 and T6-7. Recommend comparison with prior imaging in order to determine if this is the same location as prior abnormality felt to be related to B12 deficiency.     1. Faint, symmetric linear signal throughout the cervical spine suggestive of subacute combined degeneration/B12 deficiency. Please see also T-S report.   2. Multilevel neural foraminal narrowing, moderate and greatest bilaterally at C3-4 on the right and C5-C6.   3. Multilevel degenerative disc disease, moderate and greatest at C5-C6 with associated moderate central canal stenosis.     MRI full neuro-axis w wo 10/23/21  Similar appearance of T2 signal abnormality within the thoracic cord from T7 through T8. This is indeterminate in nature and could represent sequela of prior insult or demyelinating lesion.      Previous enhancing foci are not visualized in this study.     Impression   Multiple bilateral subcortical and periventricular white matter foci of T2/FLAIR hyperintensity in a pattern consistent with multiple sclerosis.     MRI full Neuro-axis w/wo 08/2022  --Unchanged extent of the cord signal abnormality extending from T6-T8. No new cord signal abnormality or abnormal enhancement.   --No definite cervical spinal cord signal abnormality although evaluation is somewhat limited due to motion artifact.   Stable findings of multiple sclerosis. No new or enhancing lesions.      Stable mild global cerebral atrophy.

## 2022-08-30 MED ORDER — GABAPENTIN 600 MG TABLET
ORAL_TABLET | 3 refills | 0 days | Status: CP
Start: 2022-08-30 — End: ?

## 2022-08-30 MED ORDER — ERGOCALCIFEROL (VITAMIN D2) 1,250 MCG (50,000 UNIT) CAPSULE
ORAL_CAPSULE | ORAL | 3 refills | 84 days | Status: CP
Start: 2022-08-30 — End: 2023-08-30

## 2022-08-30 MED ORDER — KESIMPTA PEN 20 MG/0.4 ML SUBCUTANEOUS PEN INJECTOR
3 refills | 0 days | Status: CP
Start: 2022-08-30 — End: ?

## 2022-08-30 MED ORDER — DULOXETINE 30 MG CAPSULE,DELAYED RELEASE
ORAL_CAPSULE | Freq: Two times a day (BID) | ORAL | 3 refills | 90 days | Status: CP
Start: 2022-08-30 — End: 2023-08-25

## 2022-09-01 DIAGNOSIS — G35 Multiple sclerosis: Principal | ICD-10-CM

## 2022-09-01 MED ORDER — KESIMPTA PEN 20 MG/0.4 ML SUBCUTANEOUS PEN INJECTOR
3 refills | 0 days | Status: CP
Start: 2022-09-01 — End: ?

## 2022-09-01 NOTE — Unmapped (Signed)
 I saw and evaluated the patient, participating in the key portions of the service.  I reviewed the resident???s note.  I agree with the resident???s findings and plan. Wende Bushy, MD

## 2022-09-01 NOTE — Unmapped (Signed)
Patient LVM this morning stating she needed a new prescription for Kesmipta sent to a specialty pharmacy. Returned patients call who said her Althia Forts [facility] [place], but they called her and said [facility] would be [facility] and needed a new script.     Please approve new script to [facility].    Thank you,    Jon Gills

## 2022-09-02 DIAGNOSIS — G35 Multiple sclerosis: Principal | ICD-10-CM

## 2022-09-02 MED ORDER — KESIMPTA PEN 20 MG/0.4 ML SUBCUTANEOUS PEN INJECTOR
3 refills | 0 days | Status: CP
Start: 2022-09-02 — End: ?
  Filled 2022-09-24: qty 0.4, 28d supply, fill #0

## 2022-09-02 NOTE — Unmapped (Signed)
Received VM from [facility] regarding the Kesimpta prescription sent by Dr. Lenise Arena. Pharmacy states they checked with patient plan and [facility] cannot dispense [facility]. New script needs to be sent to either [facility] or [facility]. Pharmacist also stated this script was recently filled by [facility] on 08/20/2022.    Thank you,      Jon Gills, CMA

## 2022-09-03 ENCOUNTER — Ambulatory Visit: Admit: 2022-09-03 | Discharge: 2022-09-04 | Payer: MEDICARE

## 2022-09-03 DIAGNOSIS — M25461 Effusion, right knee: Principal | ICD-10-CM

## 2022-09-03 DIAGNOSIS — M810 Age-related osteoporosis without current pathological fracture: Principal | ICD-10-CM

## 2022-09-03 DIAGNOSIS — I10 Essential (primary) hypertension: Principal | ICD-10-CM

## 2022-09-03 DIAGNOSIS — R600 Localized edema: Principal | ICD-10-CM

## 2022-09-03 MED ORDER — HYDROCHLOROTHIAZIDE 25 MG TABLET
ORAL_TABLET | Freq: Every day | ORAL | 11 refills | 30 days | Status: CP
Start: 2022-09-03 — End: 2023-09-03

## 2022-09-03 MED ORDER — ALENDRONATE 70 MG TABLET
ORAL_TABLET | ORAL | 11 refills | 28 days | Status: CP
Start: 2022-09-03 — End: 2023-09-03

## 2022-09-03 NOTE — Unmapped (Signed)
Assessment and Plan:     Lower extremity edema    Osteoporosis without current pathological fracture, unspecified osteoporosis type  -     alendronate; Take 1 tablet (70 mg total) by mouth every seven (7) days.    Hypertension, unspecified type  -     hydroCHLOROthiazide; Take 1 tablet (25 mg total) by mouth daily.    Swelling of knee joint, right      Multiple diagnoses above reviewed with patient  Continue current medication management  Rx as per orders; reviewed risks/benefits, possible side effects and patient verbalizes understanding  Referrals/labs as per orders above  Recommend supportive treatment with lifestyle modifications with diet/exercise  Follow up in 3-4 months or sooner prn     Barriers to recommended plan: None identified    I provided an intervention for the Othernon needed  SDOH domain. The intervention was Othernon needed      PHQ-2 Score: 0    PHQ-9 Score:      Edinburgh Score:      Screening complete, no depression identified / no further action needed today      I personally spent 30 minutes face-to-face and non-face-to-face in the care of this patient, which includes all pre, intra, and post visit time (reviewing, evaluating and discussing pertinent records for patient's care) on the date of service.       No follow-ups on file.    Subjective:     HPI: Catherine Campos is a 67 y.o. female here for leg swelling. Patient c/o right lower leg swelling starting at the knee and then it goes down. Denies any injuries, shortness of breath, chest pains. Occasional knee pain.     Patient also had recent dexa scan which showed osteoporosis.     HTN: on losartan.      HPI       ROS:   Review of Systems     Review of systems negative unless otherwise noted as per HPI.      The following portions of the patient's history were reviewed and updated as appropriate: allergies, current medications, past family history, past medical history, past social history, past surgical history and problem list. Objective:     Vitals:    09/03/22 0959   BP: 146/76   Pulse: 67   Temp: 35.7 ??C (96.3 ??F)     Body mass index is 36.15 kg/m??.    Physical Exam  Vitals and nursing note reviewed.   Constitutional:       General: She is not in acute distress.     Appearance: Normal appearance. She is not ill-appearing, toxic-appearing or diaphoretic.   Cardiovascular:      Rate and Rhythm: Normal rate and regular rhythm.      Heart sounds: Normal heart sounds.   Pulmonary:      Effort: Pulmonary effort is normal. No respiratory distress.      Breath sounds: Normal breath sounds.   Musculoskeletal:      Cervical back: Neck supple.      Right lower leg: Edema (mild; trace-1+ right knee and tibial edema) present.      Left lower leg: No edema.   Neurological:      Mental Status: She is alert.                      Allergies:     Codeine and Penicillins      Payton Mccallum, MD    [facility]:  Medication adherence and barriers to the treatment plan have been addressed. Opportunities to optimize healthy behaviors have been discussed. Patient / caregiver voiced understanding.

## 2022-09-12 NOTE — Unmapped (Signed)
[facility] Refill Coordination Note    Specialty Medication(s) to be Shipped:   Neurology: Kesimpta    Other medication(s) to be shipped: No additional medications requested for fill at this time     Catherine Campos, DOB: 08-13-1955  Phone: [phone] (home)       All above HIPAA information was verified with patient.     Was a Nurse, learning disability used for this call? No    Completed refill call assessment today to schedule patient's medication shipment from the [facility] [phone]).  All relevant notes have been reviewed.     Specialty medication(s) and dose(s) confirmed: Regimen is correct and unchanged.   Changes to medications: Catherine Campos reports no changes at this time.  Changes to insurance: No  New side effects reported not previously addressed with a pharmacist or physician: None reported  Questions for the pharmacist: No    Confirmed patient received a Conservation officer, historic buildings and a Surveyor, mining with first shipment. The patient will receive a drug information handout for each medication shipped and additional FDA Medication Guides as required.       DISEASE/MEDICATION-SPECIFIC INFORMATION        For patients on injectable medications: Patient currently has 0 doses left.  Next injection is scheduled for 09/26/22 .    SPECIALTY MEDICATION ADHERENCE     Medication Adherence    Patient reported X missed doses in the last month: 0  Specialty Medication: KESIMPTA PEN 20 mg/0.4 mL Pnij  Patient is on additional specialty medications: No  Patient is on more than two specialty medications: No  Any gaps in refill history greater than 2 weeks in the last 3 months: no  Demonstrates understanding of importance of adherence: yes              Were doses missed due to medication being on hold? No     KESIMPTA PEN 20 mg/0.4 mL    : 0 days of medicine on hand        REFERRAL TO PHARMACIST     Referral to the pharmacist: Not needed      [facility]     Shipping address confirmed in Epic. Patient was notified of new phone menu : Yes    Delivery Scheduled: Yes, Expected medication delivery date: 09/24/22 .     Medication will be delivered via Same Day Courier to the prescription address in Epic WAM.    Catherine Campos   [facility] Specialty Technician

## 2022-09-22 NOTE — Unmapped (Signed)
This patient's last AWV date: [facility] Last Medicare Wellness Visit Date: Not Found    Patient Phone AWV scheduled for 09/25/2022 @8 :00AM with Victorino Dike    Abstraction Result Flowsheet Data    Reason for Encounter  Reason for Encounter: Outreach  Primary Reason for Outreach: AWV  Text Message: No  MyChart Message: No  Outreach Call Outcome: Scheduled Telephone

## 2022-09-24 NOTE — Unmapped (Signed)
Thank you for completing your visit today. If you have any questions about your visit please call our Care Manager, Idalia Needle, LCSW, LCAS, CCS-I at [phone].  Please see your updated plan which details the status of your agreed upon goals and educational materials on health topics specific to you.  Here is your personalized prevention plan based on your Annual Wellness Visit today.    Medicare Screening & Prevention Guidelines Recommendations Dates Completed HM Status and Next Due Follow-Up   Colorectal Cancer Screening Patients 45 to 75: stool cards annually OR colonoscopy every 10 years (or more frequently if high risk) OR FIT-DNA every 3 years.  Colonoscopy date: Not Found  FOBT/FIT date: Not Found  Sigmoidoscopy date: Not Found  FIT-DNA date: 01/28/2022 Health Maintenance Summary     This patient has no relevant Health Maintenance data.       Up to date   DEXA Bone Density Measurement Patients age 87-95 to have a Dexa every 5 years in postmenopausal women and high risk males. Males will defer to PCP. DEXA date: 06/23/2022 Health Maintenance Summary    -      DEXA Scan (Every 5 Years) Next due on 06/23/2027    06/23/2022  XR Dexa Bone Density Skeletal             Up to date   Heart Disease Screening (fasting lipid panel) Minimum of every 5 years, patients age 21-75,  if no apparent signs or symptoms of heart disease. LDL date: 11/22/2021  Total choleseterol date: 11/22/2021  HDL date: 11/22/2021  Triglycerides date: 11/22/2021 Health Maintenance Summary     This patient has no relevant Health Maintenance data.       Up to date   Mammogram Screening Age 40-74 every 2 years.  Mammogram date: 04/03/2021 Health Maintenance Summary    -      Mammogram Start Age 81 (Every 2 Years) Next due on 04/04/2023    04/03/2021  Mammo Digital Diagnostic Tomo Right    03/08/2021  Mammography screening bilateral           Up to date   Pelvic Exam & Pap Smear Women ages 21 to 59 every 3 years with negative cytology (pap smear)  OR,   women ages 75 to 44 every 5 years if they have had both a negative pap and human papillomavirus (HPV) OR,  Every 3 years if they had a positive HPV result Pap Smear date: Not Found  HPV date: Not Found Health Maintenance Summary     This patient has no relevant Health Maintenance data.     Not within age range   Hepatitis C Screening A one-time screening for HCV infection for adults age 40 to 66 years old. HCV screening date: 01/08/2022 Health Maintenance Summary    -      Hepatitis C Screen  Completed    01/08/2022  Hepatitis C Ab component of Hepatitis C Antibody             Complete   Covid-19 Vaccine For persons 5 and older @COVIDVACCINEDATES3X @ Health Maintenance   Topic Date Due    COVID-19 Vaccine (2 - 2023-24 season) 10/11/2021      Provided information on how to obtain at pharmacy   Tdap Every 10 years (will not be covered by Medicare). Check with your pharmacy to see if you are eligible for this vaccine based on your insurance. DTap/Tdap/TD vaccination: Not Found Health Maintenance Summary    -  Overdue - DTaP/Tdap/Td Vaccines (1 - Tdap) Never done    No completion history exists for this topic.           Provided information on how to obtain at pharmacy (Tdap)   Influenza Vaccine Annually  Influenza Vaccination: 11/27/2021   Health Maintenance Summary    -      Influenza Vaccine (1) Next due on 10/12/2022    11/27/2021  Imm Admin: INFLUENZA QUAD ADJUVANTED 30YR UP(FLUAD)    12/06/2019  Imm Admin: Influenza Vaccine Quad(IM)6 MO-Adult(PF)             Up to date   PneumococcalVaccine For people ?65, or with an underlying condition Pneumonia vaccination: 01/28/2022   Health Maintenance Summary    -      Pneumococcal Vaccine 65+ (Series Information) Completed    01/28/2022  Imm Admin: Pneumococcal Conjugate 20-valent    12/06/2019  Imm Admin: PNEUMOCOCCAL POLYSACCHARIDE 23-VALENT             Complete   RSV Vaccine Current recommendations to have shared decision making with your PCP, please talk with your provider. @RULEVALUE ([id number]) Health Maintenance Summary     This patient has no relevant Health Maintenance data.     Defer to PCP   Zoster Vaccine Healthy adults 50 years and older receive 2 doses of recombinant zoster vaccine two to six months apart (may not be covered by Medicare). Check with your pharmacy to see if you are eligible for this vaccine based on your insurance. Zoster vaccination: Not Found   Health Maintenance Summary    -      Overdue - Zoster Vaccines (1 of 2) Never done    No completion history exists for this topic.           Provided information on how to obtain at pharmacy (Shingrix)   Diabetes Screening  Annually for patients 40-70, if risk factors (family hx of DM, hx of gestational DM, and/or PCOS), twice per year if diagnosed with pre-diabetes.    Range 65-99 Diabetes screening date: 05/27/2021 N/A Due, pt has PCP appt Oct 2024.

## 2022-09-25 ENCOUNTER — Institutional Professional Consult (permissible substitution)
Admit: 2022-09-25 | Discharge: 2022-09-26 | Payer: MEDICARE | Attending: Addiction (Substance Use Disorder) | Primary: Addiction (Substance Use Disorder)

## 2022-09-25 DIAGNOSIS — Z0000 Encounter for general adult medical examination without abnormal findings: Principal | ICD-10-CM

## 2022-09-25 NOTE — Unmapped (Signed)
This auto-generated note displays some results identified during the AWV Assessments. For full results, please see the Flowsheet Links under the Additional Documentation section of this encounter in Chart Review.        Inform/Regulatory Low priority, no response needed unless change in care.     I am reaching out to [facility] as part of the embedded care management team regarding her Annual Medicare Wellness visit.  Patient is at risk for BMI Abnormal and/or patient would benefit from meeting with an RD for Chronic Condition Management and Dental issues.    Patient advised to Review AVS for education related to risk.Marland Kitchen      Next appt with PCP: 12/02/22     See chart for med list if needed    Sharing communication as part of regulatory requirements.    The patient reports they are physically located in [place] and is currently: at home. I conducted a phone visit.  I spent 30 minutes on the phone call with the patient on the date of service .       Patient was unable to report the following vital signs today due to visit being performed by telehealth and patient lack of required equipment to obtain: Blood Pressure , Pulse, and Temperature. Please see flowsheets for any vital signs that were reported this visit.      Patient reported Vital Signs: Height, weight, pain      Social Determinants of Health:  Social Determinants of Health Screened today.  Interventions Provided: N/A; No issues identified.    PCP notified of above risks by  Routing encounter    The following list of current providers and suppliers reviewed and updated this visit.  Patient Care Team:  Payton Mccallum, MD as PCP - General (Family Medicine)  Payton Mccallum, MD as PCP - General-ATTRIBUTED    Medications and supplements were reviewed and updated this visit. See medication list in encounter summary.     A personalized prevent plan was updated and reviewed with the patient. A copy has been provided to the patient in Patient Instructions.    Recent Hospitalizations reviewed:  No recent hospitalizations     General Health:  Patient answered (!) No to having visited the dentist in the past year. Pt working with insurance to identify a covered dentist so she can establish care.     Patient's BMI is 36.15 Patient answered No to nutrition services referral: New patient stated goals: Pt has goal of losing weight by eating differently and staying active. She shared she is going to return to the gym.        Pain identified during today's visit        09/25/22 0803   PainSc: 0-No pain           Safety:   The patient answered (!) Yes to falling in the last year, and No to feeling unsteady while standing or walking.  Falls Assessment Review    Fall Risk Assessment was positive.  Falls risk interventions taken today were:  Provider determined patient is not at risk for falls because the fall was an accident and not related to being unsteady when standing or walking.      Psychosocial Assessment:   PHQ 2:  Patient had a PHQ 2 score of 0    PHQ 9: N/A    Screening complete, no depression identified / no further action needed today    Social Determinants of Health     Financial  Resource Strain: Low Risk  (09/25/2022)    Overall Financial Resource Strain (CARDIA)     Difficulty of Paying Living Expenses: Not hard at all   Internet Connectivity: No Internet connectivity concern identified (09/25/2022)    Internet Connectivity     Do you have access to internet services: Yes     How do you connect to the internet: Personal Device at home     Is your internet connection strong enough for you to watch video on your device without major problems?: Yes     Do you have enough data to get through the month?: Yes     Does at least one of the devices have a camera that you can use for video chat?: Yes   Food Insecurity: No Food Insecurity (09/25/2022)    Hunger Vital Sign     Worried About Running Out of Food in the Last Year: Never true     Ran Out of Food in the Last Year: Never true   Tobacco Use: Medium Risk (09/25/2022)    Patient History     Smoking Tobacco Use: Former     Smokeless Tobacco Use: Never     Passive Exposure: Never   Housing/Utilities: Low Risk  (09/25/2022)    Housing/Utilities     Within the past 12 months, have you ever stayed: outside, in a car, in a tent, in an overnight shelter, or temporarily in someone else's home (i.e. couch-surfing)?: No     Are you worried about losing your housing?: No     Within the past 12 months, have you been unable to get utilities (heat, electricity) when it was really needed?: No   Alcohol Use: Not At Risk (09/25/2022)    Alcohol Use     How often do you have a drink containing alcohol?: Monthly or less     How many drinks containing alcohol do you have on a typical day when you are drinking?: 1 - 2     How often do you have 5 or more drinks on one occasion?: Never   Transportation Needs: No Transportation Needs (09/25/2022)    PRAPARE - Transportation     Lack of Transportation (Medical): No     Lack of Transportation (Non-Medical): No   Substance Use: Low Risk  (09/25/2022)    Substance Use     Taken prescription drugs for non-medical reasons: Never     Taken illegal drugs: Never     Patient indicated they have taken drugs in the past year for non-medical reasons: No, [positive answer(s)]: Not on file   Health Literacy: Low Risk  (09/25/2022)    Health Literacy     : Never   Physical Activity: Sufficiently Active (09/25/2022)    Exercise Vital Sign     Days of Exercise per Week: 7 days     Minutes of Exercise per Session: 30 min   Interpersonal Safety: Not At Risk (09/25/2022)    Interpersonal Safety     Unsafe Where You Currently Live: No     Physically Hurt by Anyone: No     Abused by Anyone: No   Stress: No Stress Concern Present (09/25/2022)    [employer] of Occupational Health - Occupational Stress Questionnaire     Feeling of Stress : Not at all   Intimate Partner Violence: Not At Risk (09/25/2022) Humiliation, Afraid, Rape, and Kick questionnaire     Fear of Current or Ex-Partner: No     Emotionally Abused: No  Physically Abused: No     Sexually Abused: No   Depression: Not at risk (09/03/2022)    PHQ-2     PHQ-2 Score: 0   Social Connections: Moderately Integrated (09/25/2022)    Social Connection and Isolation Panel [NHANES]     Frequency of Communication with Friends and Family: More than three times a week     Frequency of Social Gatherings with Friends and Family: More than three times a week     Attends Religious Services: More than 4 times per year     Active Member of [employer] or Organizations: Yes     Attends Banker Meetings: More than 4 times per year     Marital Status: Widowed

## 2022-09-25 NOTE — Unmapped (Signed)
ADVANCE CARE PLANNING NOTE    Discussion Date:  September 25, 2022    Patient has decisional capacity:  Yes    Patient has selected a Health Care Decision-Maker if loses capacity: Yes    Health Care Decision Maker as of 09/25/2022    HCDM (patient stated preference): [facility] - Son - [phone]    HCDM (patient stated preference): [facility] - Daughter in [place] [phone]    Discussion Participants:  Idalia Needle (social work Futures trader) and pt.     Communication of Medical Status/Prognosis:   Pt reported that her health is good.    Communication of Treatment Goals/Options:   Pt reported that she wants a DNR order, and will plan to talk to PCP about this when she sees him in Oct 2024. Pt confirmed that having son and daughter in law as HCDM's in [place] is consistent with wishes. Pt does not yet have a HCPOA or living will, but was interested. Materials sent through the mail.     Treatment Decisions:   09/25/2022    Payton Mccallum, MD was present and immediately available in office suite.    The patient reports interest in education materials about a Healthcare power of attorney Living Will. Materials provided at today's visit. .      Case Manager facilitated discussion with patient about advance care planning and documentation including Healthcare power of attorney Living Will DNR order (yellow form) CPR  Breathing Machine or Ventilator Tube Feeding Kidney Dialysis purpose of ACP ACP process importance of retaining copy on file within the medical record how to get forms notarized . The patient voluntarily agreed to complete and bring to office Healthcare power of attorney Living Will.   Note: forms that require notarization require two witnessess that are non-family members nor health care workers.               I spent 5 minutes providing voluntary advance care planning services for this patient.

## 2022-10-11 NOTE — Unmapped (Signed)
Upcoming Appt:  Future Appointments   Date Time Provider Department Center   12/02/2022  1:40 PM Payton Mccallum, MD [id number]   12/04/2022  3:05 PM Harvie Bridge, MD [facility]       Recent:   What is the date of your last related visit?  No recent   Related acute medications Rx'd:  na  Home treatment tried:  Tylenol      Relevant:   Allergies: Codeine and Penicillins  Medications: Gabapentin   Health History: MS, degenerative disc, HTN,Neuropathy,   Weight: na      [place] [facility] patients only:  What was the date of your last cancer treatment (mm/dd/yy)?: na  Was the treatment oral or infusion?: na  Are you currently on TVEC (yes/no)?: na  Reason for Disposition   [1] MODERATE back pain (e.g., interferes with normal activities) AND [2] present > 3 days    Answer Assessment - Initial Assessment Questions  1. ONSET: When did the pain begin?       10/08/22  2. LOCATION: Where does it hurt? (upper, mid or lower back)      Lower back pain   3. SEVERITY: How bad is the pain?  (e.g., Scale 1-10; mild, moderate, or severe)    - MILD (1-3): Doesn't interfere with normal activities.     - MODERATE (4-7): Interferes with normal activities or awakens from sleep.     - SEVERE (8-10): Excruciating pain, unable to do any normal activities.       Moderate   4. PATTERN: Is the pain constant? (e.g., yes, no; constant, intermittent)       Constant for the last four days   5. RADIATION: Does the pain shoot into your legs or somewhere else?      No radiation   6. CAUSE:  What do you think is causing the back pain?       Degenerative disc and MS   7. BACK OVERUSE:  Any recent lifting of heavy objects, strenuous work or exercise?      Hurts worse with activities   8. MEDICINES: What have you taken so far for the pain? (e.g., nothing, acetaminophen, NSAIDS)      Tylenol   9. NEUROLOGIC SYMPTOMS: Do you have any weakness, numbness, or problems with bowel/bladder control?      Numbness to legs hx neuropathy T  10. OTHER SYMPTOMS: Do you have any other symptoms? (e.g., fever, abdomen pain, burning with urination, blood in urine)        No urinary symptoms, no fever, no ABD pain   11. PREGNANCY: Is there any chance you are pregnant? When was your last menstrual period?        N/A    Protocols used: Back Pain-A-AH

## 2022-10-22 NOTE — Unmapped (Signed)
[facility] Refill Coordination Note    Specialty Medication(s) to be Shipped:   Neurology: Kesimpta    Other medication(s) to be shipped: No additional medications requested for fill at this time     Catherine Campos, DOB: 02-04-1956  Phone: [phone] (home)       All above HIPAA information was verified with patient.     Was a Nurse, learning disability used for this call? No    Completed refill call assessment today to schedule patient's medication shipment from the [facility] [phone]).  All relevant notes have been reviewed.     Specialty medication(s) and dose(s) confirmed: Regimen is correct and unchanged.   Changes to medications: [name] reports no changes at this time.  Changes to insurance: No  New side effects reported not previously addressed with a pharmacist or physician: None reported  Questions for the pharmacist: No    Confirmed patient received a Conservation officer, historic buildings and a Surveyor, mining with first shipment. The patient will receive a drug information handout for each medication shipped and additional FDA Medication Guides as required.       DISEASE/MEDICATION-SPECIFIC INFORMATION        For patients on injectable medications: Patient currently has 0 doses left.  Next injection is scheduled for 10/27/22 .    SPECIALTY MEDICATION ADHERENCE     Medication Adherence    Patient reported X missed doses in the last month: 0  Specialty Medication: KESIMPTA PEN 20 mg/0.4 mL Pnij  Patient is on additional specialty medications: No  Patient is on more than two specialty medications: No  Any gaps in refill history greater than 2 weeks in the last 3 months: no  Demonstrates understanding of importance of adherence: yes              Were doses missed due to medication being on hold? No     KESIMPTA PEN 20 mg/0.4 mL    : 0 days of medicine on hand        REFERRAL TO PHARMACIST     Referral to the pharmacist: Not needed      [facility]     Shipping address confirmed in Epic. Patient was notified of new phone menu : Yes    Delivery Scheduled: Yes, Expected medication delivery date: 10/23/22 .     Medication will be delivered via Same Day Courier to the prescription address in Epic WAM.    [name]   [facility] Specialty Technician

## 2022-10-23 MED FILL — KESIMPTA PEN 20 MG/0.4 ML SUBCUTANEOUS PEN INJECTOR: 28 days supply | Qty: 0.4 | Fill #1

## 2022-11-11 NOTE — Unmapped (Signed)
[facility] Delivery Pharm[facility] Refill Coordination Note    Catherine Campos, DOB: 05/02/1955  Phone: [phone] (home)       All above HIPAA information was verified with patient.         11/08/2022     5:14 PM   Specialty Rx Medication Refill Questionnaire   Which Medications would you like refilled and shipped? Kesimpta Pen 20 mg/0.4   If medication refills are not needed at this time, please indicate the reason below. Need 11/24/22   Please list all current allergies: Condine / pencillen   Have you missed any doses in the last 30 days? No   Have you had any changes to your medication(s) since your last refill? No   How many days remaining of each medication do you have at home? None   If receiving an injectable medication, next injection date is 11/26/2022   Have you experienced any side effects in the last 30 days? No   Please enter the full address (street address, city, state, zip code) where you would like your medication(s) to be delivered to. [address] [place],  [place] [address]   Please specify on which day you would like your medication(s) to arrive. Note: if you need your medication(s) within 3 days, please call the pharmacy to schedule your order at [phone]  11/24/2022   Has your insurance changed since your last refill? No   Would you like a pharmacist to call you to discuss your medication(s)? No   Do you require a signature for your package? (Note: if we are billing Medicare Part B or your order contains a controlled substance, we will require a signature) No   Additional Comments: None         Completed refill call assessment today to schedule patient's medication shipment from the [facility] [phone]).  All relevant notes have been reviewed.       Confirmed patient received a Conservation officer, historic buildings and a Surveyor, mining with first shipment. The patient will receive a drug information handout for each medication shipped and additional FDA Medication Guides as required.         REFERRAL TO PHARMACIST     Referral to the pharmacist: Not needed      [facility]     Shipping address confirmed in Epic.     Delivery Scheduled: Yes, Expected medication delivery date: 11/24/2022.     Medication will be delivered via Same Day Courier to the prescription address in Epic WAM.    Dorisann Frames   [facility] Specialty Technician

## 2022-11-24 DIAGNOSIS — E785 Hyperlipidemia, unspecified: Principal | ICD-10-CM

## 2022-11-24 MED ORDER — ATORVASTATIN 10 MG TABLET
ORAL_TABLET | Freq: Every day | ORAL | 3 refills | 0 days
Start: 2022-11-24 — End: ?

## 2022-11-24 MED FILL — KESIMPTA PEN 20 MG/0.4 ML SUBCUTANEOUS PEN INJECTOR: 28 days supply | Qty: 0.4 | Fill #2

## 2022-11-25 MED ORDER — ATORVASTATIN 10 MG TABLET
ORAL_TABLET | ORAL | 3 refills | 90 days | Status: CP
Start: 2022-11-25 — End: ?

## 2022-12-18 NOTE — Unmapped (Signed)
[facility] and [facility] Delivery Pharm[facility] Refill Coordination Note    Catherine Campos, DOB: 1955-08-30  Phone: [phone] (home)       All above HIPAA information was verified with patient.         12/17/2022     4:27 PM   Specialty Rx Medication Refill Questionnaire   Which Medications would you like refilled and shipped? Kesimpta Pen 20 mg/0.4   If medication refills are not needed at this time, please indicate the reason below. Refill   Please list all current allergies: Codeine/Pencilein   Have you missed any doses in the last 30 days? No   Have you had any changes to your medication(s) since your last refill? No   How many days remaining of each medication do you have at home? None   If receiving an injectable medication, next injection date is 12/27/2022   Have you experienced any side effects in the last 30 days? No   Please enter the full address (street address, city, state, zip code) where you would like your medication(s) to be delivered to. [address] [place] [address]   Please specify on which day you would like your medication(s) to arrive. Note: if you need your medication(s) within 3 days, please call the pharmacy to schedule your order at [phone]  12/25/2022   Has your insurance changed since your last refill? No   Would you like a pharmacist to call you to discuss your medication(s)? No   Do you require a signature for your package? (Note: if we are billing Medicare Part B or your order contains a controlled substance, we will require a signature) No   Additional Comments: None at time.         Completed refill call assessment today to schedule patient's medication shipment from the [facility] [phone]).  All relevant notes have been reviewed.       Confirmed patient received a Conservation officer, historic buildings and a Surveyor, mining with first shipment. The patient will receive a drug information handout for each medication shipped and additional FDA Medication Guides as required.         REFERRAL TO PHARMACIST     Referral to the pharmacist: Not needed      [facility]     Shipping address confirmed in Epic.     Delivery Scheduled: Yes, Expected medication delivery date: 12/25/22 .     Medication will be delivered via Same Day Courier to the prescription address in Epic WAM.    [name]   [facility] Delivery Pharm[facility] Specialty Technician

## 2022-12-22 NOTE — Unmapped (Signed)
 Assessment and Plan:     Fatigue, unspecified type  -     Comprehensive Metabolic Panel  -     TSH  -     CBC w/ Differential  -     Hemoglobin A1c    Other specified disorders of carbohydrate metabolism (CMS-HCC)  -     Hemoglobin A1c    Counseling regarding advanced care planning and goals of care    Hypertension, unspecified type    Need for vaccination  -     INFLUENZA IIV3 HIGH DOSE 54YRS+(FLUZONE)    Other orders  -     albuterol sulfate; Inhale 2 puffs every six (6) hours as needed for wheezing or shortness of breath.      Multiple diagnoses above reviewed with patient  Advance care planning documents reviewed and discussed in detail with patient. Copies scanned to chart. Patient appears to be at decision-making capacity today.  Blood pressure well controlled at triage today. Continue to monitor.  Continue current medication management. Refills as above.  Ordering CBC w/ diff, TSH, A1c, CMP today for further evaluation of fatigue  Recommend supportive treatment with lifestyle modifications with diet/exercise  Follow up in 6 months or sooner prn      Barriers to recommended plan: None identified      I personally spent 30 minutes face-to-face and non-face-to-face in the care of this patient, which includes all pre, intra, and post visit time (reviewing, evaluating and discussing pertinent records for patient's care) on the date of service.     Return in about 6 months (around 06/23/2023) for Recheck.    Subjective:     HPI: Catherine Campos is a 67 y.o. female here for follow up. Last seen by me 09/03/22 for LE edema. Patient with h/o HTN, MS, dyslipidemia, insomnia, neuropathy, osteoporosis. When asked how she was doing today patient responded, I'm here.    Hypertension  Patient with history of hypertension manages with HCTZ 25 mg daily and losartan 100 mg daily. Blood pressure at triage today 114/68.     BP Readings from Last 3 Encounters:   12/24/22 114/68   09/03/22 146/76   08/29/22 156/76     Fatigue and low energy  Endorses fatigue x 1 month. States she gets tired very easily and is sleeping more than usual. Patient notes she will wake up between 12 PM and 1 PM. Denies chest pain, shortness of breath, feelings of depression. Patient reports she is a picky eater but feels she is eating per her baseline. Of note patient has history of MS, currently taking Kesimpta, and is followed by [facility]; she is unsure if her symptoms are related.    Code status  Patient would like to discuss DNR. States she does not wish to have life-saving measures enacted if she were to be placed in a situation where they may be required. Patient verbalizes her wish to go in peace. She has not discussed this with other doctors but reports she discussed with her husband prior to his passing from prostate cancer. Has also discussed with her children regarding her wishes.     HPI     ROS:   Review of Systems     Review of systems negative unless otherwise noted as per HPI.      The following portions of the patient's history were reviewed and updated as appropriate: allergies, current medications, past family history, past medical history, past social history, past surgical history and problem list.  Objective:     Vitals:    12/24/22 1327   BP: 114/68   Pulse: 80   Temp: 37.1 ??C (98.8 ??F)     Body mass index is 34.5 kg/m??.    Physical Exam  Vitals and nursing note reviewed.   Constitutional:       General: She is not in acute distress.     Appearance: Normal appearance. She is not ill-appearing or toxic-appearing.   HENT:      Head: Normocephalic and atraumatic.   Cardiovascular:      Rate and Rhythm: Normal rate and regular rhythm.      Heart sounds: Normal heart sounds.   Pulmonary:      Effort: Pulmonary effort is normal. No respiratory distress.      Breath sounds: Normal breath sounds.   Musculoskeletal:      Cervical back: Neck supple.   Neurological:      General: No focal deficit present.      Mental Status: She is alert and oriented to person, place, and time. Mental status is at baseline.          Allergies:     Codeine and Penicillins    [facility]:     Medication adherence and barriers to the treatment plan have been addressed. Opportunities to optimize healthy behaviors have been discussed. Patient / caregiver voiced understanding.      I attest that I, Zuling F Quade, personally documented this note while acting as scribe for Payton Mccallum, MD.      Ranee Gosselin, Scribe.  12/24/2022     The documentation recorded by the scribe accurately reflects the service I personally performed and the decisions made by me.    Payton Mccallum, MD

## 2022-12-24 ENCOUNTER — Ambulatory Visit: Admit: 2022-12-24 | Discharge: 2022-12-25 | Payer: MEDICARE

## 2022-12-24 DIAGNOSIS — I10 Essential (primary) hypertension: Principal | ICD-10-CM

## 2022-12-24 DIAGNOSIS — E7489 Other specified disorders of carbohydrate metabolism (CMS-HCC): Principal | ICD-10-CM

## 2022-12-24 DIAGNOSIS — R5383 Other fatigue: Principal | ICD-10-CM

## 2022-12-24 DIAGNOSIS — Z23 Encounter for immunization: Principal | ICD-10-CM

## 2022-12-24 DIAGNOSIS — Z7189 Other specified counseling: Principal | ICD-10-CM

## 2022-12-24 LAB — CBC W/ AUTO DIFF
BASOPHILS ABSOLUTE COUNT: 0.1 10*9/L (ref 0.0–0.1)
BASOPHILS RELATIVE PERCENT: 0.8 %
EOSINOPHILS ABSOLUTE COUNT: 0.2 10*9/L (ref 0.0–0.5)
EOSINOPHILS RELATIVE PERCENT: 2.8 %
HEMATOCRIT: 42.5 % (ref 34.0–44.0)
HEMOGLOBIN: 13.8 g/dL (ref 11.3–14.9)
LYMPHOCYTES ABSOLUTE COUNT: 1.7 10*9/L (ref 1.1–3.6)
LYMPHOCYTES RELATIVE PERCENT: 25.4 %
MEAN CORPUSCULAR HEMOGLOBIN CONC: 32.5 g/dL (ref 32.0–36.0)
MEAN CORPUSCULAR HEMOGLOBIN: 30.2 pg (ref 25.9–32.4)
MEAN CORPUSCULAR VOLUME: 92.8 fL (ref 77.6–95.7)
MEAN PLATELET VOLUME: 8.4 fL (ref 6.8–10.7)
MONOCYTES ABSOLUTE COUNT: 0.6 10*9/L (ref 0.3–0.8)
MONOCYTES RELATIVE PERCENT: 8.6 %
NEUTROPHILS ABSOLUTE COUNT: 4.2 10*9/L (ref 1.8–7.8)
NEUTROPHILS RELATIVE PERCENT: 62.4 %
NUCLEATED RED BLOOD CELLS: 0 /100{WBCs} (ref <=4)
PLATELET COUNT: 294 10*9/L (ref 150–450)
RED BLOOD CELL COUNT: 4.58 10*12/L (ref 3.95–5.13)
RED CELL DISTRIBUTION WIDTH: 15.2 % (ref 12.2–15.2)
WBC ADJUSTED: 6.8 10*9/L (ref 3.6–11.2)

## 2022-12-24 LAB — COMPREHENSIVE METABOLIC PANEL
ALBUMIN: 3.7 g/dL (ref 3.4–5.0)
ALKALINE PHOSPHATASE: 69 U/L (ref 46–116)
ALT (SGPT): 10 U/L (ref 10–49)
ANION GAP: 5 mmol/L (ref 5–14)
AST (SGOT): 20 U/L (ref <=34)
BILIRUBIN TOTAL: 0.7 mg/dL (ref 0.3–1.2)
BLOOD UREA NITROGEN: 10 mg/dL (ref 9–23)
BUN / CREAT RATIO: 13
CALCIUM: 9.8 mg/dL (ref 8.7–10.4)
CHLORIDE: 103 mmol/L (ref 98–107)
CO2: 34.1 mmol/L — ABNORMAL HIGH (ref 20.0–31.0)
CREATININE: 0.76 mg/dL
EGFR CKD-EPI (2021) FEMALE: 87 mL/min/{1.73_m2} (ref >=60)
GLUCOSE RANDOM: 98 mg/dL (ref 70–179)
POTASSIUM: 3.8 mmol/L (ref 3.4–4.8)
PROTEIN TOTAL: 7.8 g/dL (ref 5.7–8.2)
SODIUM: 142 mmol/L (ref 135–145)

## 2022-12-24 LAB — HEMOGLOBIN A1C
ESTIMATED AVERAGE GLUCOSE: 123 mg/dL
HEMOGLOBIN A1C: 5.9 % — ABNORMAL HIGH (ref 4.8–5.6)

## 2022-12-24 LAB — TSH: THYROID STIMULATING HORMONE: 0.891 u[IU]/mL (ref 0.550–4.780)

## 2022-12-24 MED ORDER — ALBUTEROL SULFATE HFA 90 MCG/ACTUATION AEROSOL INHALER
Freq: Four times a day (QID) | RESPIRATORY_TRACT | 0 refills | 0 days | Status: CP | PRN
Start: 2022-12-24 — End: 2023-12-24

## 2022-12-24 NOTE — Unmapped (Signed)
 Patient in clinic for office visit, FluLaval - 6 months and up Flu Vaccine vaccine administered per protocol,NCIR and/or EPIC chart reviewed and vaccine accuracy verified with teammates: 2024-2025 Flu vaccine season , patient eligible to receive vacstock: Private Vaccine, vaccine administered Right Deltoid, pt tolerated well, no s/s of a reaction noted, VIS given to patient

## 2022-12-25 MED FILL — KESIMPTA PEN 20 MG/0.4 ML SUBCUTANEOUS PEN INJECTOR: 28 days supply | Qty: 0.4 | Fill #3

## 2023-01-11 NOTE — Unmapped (Signed)
[facility] Specialty and Home Delivery Pharmacy Refill Coordination Note    Catherine Campos, DOB: 12-13-1955  Phone: [phone] (home)       All above HIPAA information was verified with patient.         01/10/2023    11:40 AM   Specialty Rx Medication Refill Questionnaire   Which Medications would you like refilled and shipped? Kesimpta Pen 20mg /0.4   If medication refills are not needed at this time, please indicate the reason below. No   Please list all current allergies: Codeine/ Pencilen   Have you missed any doses in the last 30 days? No   Have you had any changes to your medication(s) since your last refill? No   How many days remaining of each medication do you have at home? 16 day's   If receiving an injectable medication, next injection date is 01/26/2023   Have you experienced any side effects in the last 30 days? No   Please enter the full address (street address, city, state, zip code) where you would like your medication(s) to be delivered to. [address] [place] [address]   Please specify on which day you would like your medication(s) to arrive. Note: if you need your medication(s) within 3 days, please call the pharmacy to schedule your order at [phone]  01/23/2023   Has your insurance changed since your last refill? No   Would you like a pharmacist to call you to discuss your medication(s)? No   Do you require a signature for your package? (Note: if we are billing Medicare Part B or your order contains a controlled substance, we will require a signature) No   Additional Comments: None         Completed refill call assessment today to schedule patient's medication shipment from the [facility] Special[facility] and Home Delivery Pharmacy [phone]).  All relevant notes have been reviewed.       Confirmed patient received a Conservation officer, historic buildings and a Surveyor, mining with first shipment. The patient will receive a drug information handout for each medication shipped and additional FDA Medication Guides as required.         REFERRAL TO PHARMACIST     Referral to the pharmacist: Not needed      [facility]     Shipping address confirmed in Epic.     Delivery Scheduled: Yes, Expected medication delivery date: 01/23/23 .     Medication will be delivered via Same Day Courier to the prescription address in Epic WAM.    [name]   [facility] Specialty and Home Delivery Pharmacy Specialty Technician

## 2023-01-23 MED FILL — KESIMPTA PEN 20 MG/0.4 ML SUBCUTANEOUS PEN INJECTOR: 28 days supply | Qty: 0.4 | Fill #4

## 2023-02-13 NOTE — Unmapped (Signed)
[facility] Specialty and Home Delivery Pharmacy Refill Coordination Note    Catherine Campos, DOB: 03/26/1955  Phone: [phone] (home)       All above HIPAA information was verified with patient.         02/12/2023     8:25 PM   Specialty Rx Medication Refill Questionnaire   Which Medications would you like refilled and shipped? Kesimpta Pen 20mg    If medication refills are not needed at this time, please indicate the reason below. No   Please list all current allergies: Codeine/ Pencillen   Have you missed any doses in the last 30 days? No   Have you had any changes to your medication(s) since your last refill? No   How many days remaining of each medication do you have at home? None   If receiving an injectable medication, next injection date is 02/26/2023   Have you experienced any side effects in the last 30 days? No   Please enter the full address (street address, city, state, zip code) where you would like your medication(s) to be delivered to. [address] [place] [address]   Please specify on which day you would like your medication(s) to arrive. Note: if you need your medication(s) within 3 days, please call the pharmacy to schedule your order at [phone]  02/24/2023   Has your insurance changed since your last refill? No   Would you like a pharmacist to call you to discuss your medication(s)? No   Do you require a signature for your package? (Note: if we are billing Medicare Part B or your order contains a controlled substance, we will require a signature) No   Additional Comments: No         Completed refill call assessment today to schedule patient's medication shipment from the [facility] Special[facility] and Home Delivery Pharmacy [phone]).  All relevant notes have been reviewed.       Confirmed patient received a Conservation officer, historic buildings and a Surveyor, mining with first shipment. The patient will receive a drug information handout for each medication shipped and additional FDA Medication Guides as required.         REFERRAL TO PHARMACIST     Referral to the pharmacist: Not needed      [facility]     Shipping address confirmed in Epic.     Delivery Scheduled: Yes, Expected medication delivery date: 02/24/23.     Medication will be delivered via Same Day Courier to the prescription address in Epic WAM.    Dan Europe   [facility] Specialty and Home Delivery Pharmacy Specialty Technician

## 2023-02-24 MED FILL — KESIMPTA PEN 20 MG/0.4 ML SUBCUTANEOUS PEN INJECTOR: 28 days supply | Qty: 0.4 | Fill #5

## 2023-03-07 DIAGNOSIS — F5101 Primary insomnia: Principal | ICD-10-CM

## 2023-03-07 MED ORDER — TRAZODONE 50 MG TABLET
ORAL_TABLET | Freq: Every evening | ORAL | 1 refills | 0.00 days
Start: 2023-03-07 — End: ?

## 2023-03-07 MED ORDER — ALBUTEROL SULFATE HFA 90 MCG/ACTUATION AEROSOL INHALER
0 refills | 0.00 days
Start: 2023-03-07 — End: ?

## 2023-03-09 MED ORDER — TRAZODONE 50 MG TABLET
ORAL_TABLET | Freq: Every evening | ORAL | 1 refills | 90.00 days
Start: 2023-03-09 — End: ?

## 2023-03-09 MED ORDER — ALBUTEROL SULFATE HFA 90 MCG/ACTUATION AEROSOL INHALER
1 refills | 0.00 days | Status: CP
Start: 2023-03-09 — End: ?

## 2023-03-09 NOTE — Unmapped (Signed)
Unable to complete refill request, medication is not included in the refill protocol.   Please review below request.     Patient is requesting the following refill  Requested Prescriptions     Pending Prescriptions Disp Refills    traZODone (DESYREL) 50 MG tablet [Pharmacy Med Name: TRAZODONE 50 MG TABLET] 90 tablet 1     Sig: TAKE 1 TABLET BY MOUTH EVERY DAY AT NIGHT     Signed Prescriptions Disp Refills    albuterol HFA 90 mcg/actuation inhaler 8 g 1     Sig: INHALE 2 PUFFS EVERY SIX HOURS AS NEEDED FOR WHEEZING OR SHORTNESS OF BREATH.     Authorizing Provider: Payton Mccallum     Ordering User: Cleophus Molt       Recent Visits  Date Type Provider Dept   12/24/22 Office Visit Payton Mccallum, MD [facility] [address] [facility]   09/03/22 Office Visit Payton Mccallum, MD [facility] [address] [facility]   05/30/22 Office Visit Payton Mccallum, MD [place] [facility] [address] [place]   Showing recent visits within past 365 days and meeting all other requirements  Future Appointments  Date Type Provider Dept   06/23/23 Appointment Payton Mccallum, MD  Family Medicine [address] [place]   Showing future appointments within next 365 days and meeting all other requirements       Labs: Not applicable this refill

## 2023-03-10 MED ORDER — TRAZODONE 50 MG TABLET
ORAL_TABLET | Freq: Every evening | ORAL | 1 refills | 90.00 days | Status: CP
Start: 2023-03-10 — End: ?

## 2023-03-19 NOTE — Unmapped (Signed)
[facility] Specialty and Home Delivery Pharmacy Refill Coordination Note    Catherine Campos, DOB: [birth year]  Phone: [phone] (home)       All above HIPAA information was verified with patient.         03/18/2023    10:49 AM   Specialty Rx Medication Refill Questionnaire   Which Medications would you like refilled and shipped? Kesimpta Pen 20mg    Please list all current allergies: Codeine/Pencillen   Have you missed any doses in the last 30 days? No   Have you had any changes to your medication(s) since your last refill? No   How many days remaining of each medication do you have at home? None   If receiving an injectable medication, next injection date is 03/29/2023   Have you experienced any side effects in the last 30 days? No   Please enter the full address (street address, city, state, zip code) where you would like your medication(s) to be delivered to. [address] [place] [address]   Please specify on which day you would like your medication(s) to arrive. Note: if you need your medication(s) within 3 days, please call the pharmacy to schedule your order at [phone]  03/27/2023   Has your insurance changed since your last refill? No   Would you like a pharmacist to call you to discuss your medication(s)? No   Do you require a signature for your package? (Note: if we are billing Medicare Part B or your order contains a controlled substance, we will require a signature) No   Additional Comments: None         Completed refill call assessment today to schedule patient's medication shipment from the [facility] Specialty and Home Delivery Pharmacy [phone]).  All relevant notes have been reviewed.       Confirmed patient received a Conservation officer, historic buildings and a Surveyor, mining with first shipment. The patient will receive a drug information handout for each medication shipped and additional FDA Medication Guides as required.         REFERRAL TO PHARMACIST     Referral to the pharmacist: Not needed      [facility]     Shipping address confirmed in Epic.     Delivery Scheduled: Yes, Expected medication delivery date: 03/27/23.     Medication will be delivered via Same Day Courier to the prescription address in Epic WAM.    Dan Europe   [facility] Specialty and Home Delivery Pharmacy Specialty Technician

## 2023-03-27 ENCOUNTER — Ambulatory Visit: Admit: 2023-03-27 | Discharge: 2023-03-28 | Payer: MEDICARE

## 2023-03-27 DIAGNOSIS — E559 Vitamin D deficiency, unspecified: Principal | ICD-10-CM

## 2023-03-27 DIAGNOSIS — G35 Multiple sclerosis: Principal | ICD-10-CM

## 2023-03-27 LAB — IGG: GAMMAGLOBULIN; IGG: 1341 mg/dL (ref 650–1600)

## 2023-03-27 LAB — CBC W/ AUTO DIFF
BASOPHILS ABSOLUTE COUNT: 0.1 10*9/L (ref 0.0–0.1)
BASOPHILS RELATIVE PERCENT: 0.9 %
EOSINOPHILS ABSOLUTE COUNT: 0.2 10*9/L (ref 0.0–0.5)
EOSINOPHILS RELATIVE PERCENT: 2.3 %
HEMATOCRIT: 43.2 % (ref 34.0–44.0)
HEMOGLOBIN: 14.2 g/dL (ref 11.3–14.9)
LYMPHOCYTES ABSOLUTE COUNT: 1.9 10*9/L (ref 1.1–3.6)
LYMPHOCYTES RELATIVE PERCENT: 24.6 %
MEAN CORPUSCULAR HEMOGLOBIN CONC: 33 g/dL (ref 32.0–36.0)
MEAN CORPUSCULAR HEMOGLOBIN: 30.9 pg (ref 25.9–32.4)
MEAN CORPUSCULAR VOLUME: 93.7 fL (ref 77.6–95.7)
MEAN PLATELET VOLUME: 8 fL (ref 6.8–10.7)
MONOCYTES ABSOLUTE COUNT: 0.7 10*9/L (ref 0.3–0.8)
MONOCYTES RELATIVE PERCENT: 8.5 %
NEUTROPHILS ABSOLUTE COUNT: 4.9 10*9/L (ref 1.8–7.8)
NEUTROPHILS RELATIVE PERCENT: 63.7 %
PLATELET COUNT: 349 10*9/L (ref 150–450)
RED BLOOD CELL COUNT: 4.61 10*12/L (ref 3.95–5.13)
RED CELL DISTRIBUTION WIDTH: 14.9 % (ref 12.2–15.2)
WBC ADJUSTED: 7.7 10*9/L (ref 3.6–11.2)

## 2023-03-27 LAB — IGM: GAMMAGLOBULIN; IGM: 68 mg/dL (ref 40–230)

## 2023-03-27 MED ORDER — CAPSAICIN 0.1 % TOPICAL CREAM
Freq: Three times a day (TID) | TOPICAL | 0 refills | 0.00 days
Start: 2023-03-27 — End: ?

## 2023-03-27 MED FILL — KESIMPTA PEN 20 MG/0.4 ML SUBCUTANEOUS PEN INJECTOR: 28 days supply | Qty: 0.4 | Fill #6

## 2023-03-27 NOTE — Unmapped (Addendum)
 Outpatient Neurology Consult Note     [facility]   [address]  [place] [address]  [phone]    Date: 03/27/2023  Patient Name: Catherine Campos  MRN: [id number]  PCP: Therese Sarah, MD     Assessment and Plan        Catherine Campos is a 68 y.o. female with a past medical history of COPD, HTN, HLD, and B12 deficiency presenting for follow up of BLLE paresthesias and RRMS.    #RRMS  Met McDonald criteria at age 30 with >=2 lesions (enhancing thoracic spinal cord lesions in 2022, subcortical and periventricular white matter brain lesions discovered in 2023) and >=2 clinical relapses of BLLE paresthesia starting in 2022, however first possible episode was in 2018 with dizziness, right-sided weakness and numbness and blurred vision for 2-3 days diagnosed as TIA.  MS mimickers were negative (serum testing).  Symptoms stable and minimal.  Has distal lower extremity hyperreflexia, sensory loss, gait imbalance.  Surveillance MRI neuro-axis 08/2022 stable without new or enhancing lesions.  Plan to continue MS therapy and treat neuropathic type pain symptomatically.      Plan:  - Continue Kesimpta monthly injections  - For neuropathic pain:  Increase duloxetine to 60mg  AM / 30mg  PM, continue gabapentin 1200/600/600 with extra 600mg  prn once daily, trial capsaicin cream prn  - Continue Vitamin D but changed from weekly 50,000 units to OTC 4000 units daily    - Annual surveillance MRI brain, c-spine, t-spine w/wo ~08/2023      - 6 month monitoring labs today:  CBC w diff, IgM, IgG, vitamin D  - Re-requesting powershare of 2018 MRI brain wo from [facility]    Return Visit Discussed: Return in about 6 months (around 09/24/2023).      This patient was seen and discussed with Dr. Thedore Mins who agrees with the above assessment and plan.     I personally spent 30 minutes face-to-face and non-face-to-face in the care of this patient, which includes all pre, intra, and post visit time on the date of service.  All documented time was specific to the E/M visit and does not include any procedures that may have been performed.     Prudy Feeler MD  Resident Physician PGY3  [facility] Department of Neurology            HPI         HPI 06/07/20: Catherine Campos is a 68 y.o. right handed female seen in consultation at the [facility] at the request of Dr. Judd Gaudier for evaluation of bilateral pedal numbness.  PMH COPD, HTN, HLD and B12 deficiency.    1 month history of bilateral foot numbness. First started on the right foot up to the ankles then it progressed to the left foot. She went to her PCP and was noted to have low B12 level and she underwent B12 shots for 4 weeks but symptoms did not improve. She is also currently taking Duloxetine 30mg  BID but with no improvement in her symptoms. She describes the sensation as heaviness, pounding and sand like feeling on both of them.  No burning sensation. No pain. No prior history. Retired, work previously as a Lawyer. No recent weight loss.   Glass of wine 2x/weekly.  No hx of DM.  Recent blood work at PCP on 04/2020 A1c 5.3, TSH 0.499(wnl) B12 173 but repleted.    Interval History summary from  09/2021-on:  -Spinal MRI 06/2020 consistent with subacute combined degeneration and degenerative disease.    -EMG completed 06/2021 without evidence of large fiber neuropathy.     -Labs from 07/2020:  B12 892, B1 nl, B6 nl, Homocysteine hi 20, MMA nl, Gliadin IgA high 25, Gliadin IgG nl, SPEP nl, CRP nl, ESR nl, ENA nl, ANA positive 1:80, Copper nl.  Next bloodwork scheduled for Oct 2023.  -Pt has been compliant with B12, B6 and multivitamin  -Symptoms have gotten worse, with numbness to just below her knees, also gets a tight feeling in the same region.  Feels like she's walking on sponges or sand.  2-3 months ago started having days where her toes and feet hurt with throbbing or burning or pins and needles pain to the point she cannot put socks on.  Now needs to walk with a cane because she sometimes feels unsteady with walking.  No falls.  Able to feel temperature and pain.  -Clarifying history:  Symptoms started March 2022 with numbness in BL lower legs.  She had previously reported worsening of symptoms over time.  But she actually had relapsing remitting symptoms since onset in with weeks at normal baseline and then getting slammed with symptoms out of the blue lasting a few weeks.  Numbness has evolved over time to numbness and pain in BL lower legs.  Has had at least 3 relapses.  -Per chart review, found episode in 05/2016 with dizziness, right-sided weakness and numbness and blurred vision for 2-3 days.  Patient recalls this event and says she was diagnosed with a TIA.  Symptoms never recurred.    - MRI brain in Sept 2023 with multiple subcortical and periventricular white matter lesions; MRI spine now with non-enhancing lesions that were previously enhancing.    -Blood-work 09/2021:  ACE nl, IL-2 nl, AI myelopathy panel neg, B12 hi, B6 nl, B1 nl, Rf nl, APLS nl, folate nl, vitamin D low 19.5, vitamin E nl, RPR nl, Lyme nl, HIV nl, TSH nl  -Started Kesimpta 01/21/22, tolerating well.       Interval History 03/2023:    Overall patient is doing well, but continues complaining of neuropathic pain.  Started Kesimpta 01/21/22, continues monthly injections, tolerating well.  MRI neuro-axis stable 08/2022.      Current symptoms:  Vision/double vision:  Has recent annual ophthalmology eval next week  Speech, swallowing problems:  No  Weakness:  No  Fatigue:  Low energy occasionally, not even once a week  Spasms:  No   Tingling/numbness/pain:  Continues to complain of numbness and heaviness in both legs, worse in the mornings.  Continues on Duloxetine 30mg  BID and Gabapentin 1200/600/600 with decent control of painful neuropathy.   Balance/coordination problems:  No.    Gait:  No   Falls:  Had one fall a few weeks ago, mechanical trip and fall with old slippers  Bowel/bladder control problems:  Constipation, using miralax and senna.  No bladder issues.  Memory, mood:  No   Headaches:  No  Seizures:  No  COVID vaccine:  original shots and booster x1  Other vaccines:  flu, PNA, due for shingles   Malignancy screening:  Has PCP, FIT DNA neg 01/2022, mammogram last 03/2021 (will schedule 2024 appointment soon)    Vitamin D:  yes, weekly  Family planning:  post-menopausal     T25FW:  8 seconds (last 03/2022 9 seconds)    Allergies   Allergen Reactions    Codeine Hives and Swelling  Tongue swelling    Penicillins Hives and Swelling     Has patient had a PCN reaction causing immediate rash, facial/tongue/throat swelling, SOB or lightheadedness with hypotension: Yes  Has patient had a PCN reaction causing severe rash involving mucus membranes or skin necrosis: No  Has patient had a PCN reaction that required hospitalization No  Has patient had a PCN reaction occurring within the last 10 years: Yes  If all of the above answers are NO, then may proceed with Cephalosporin use.        Current Outpatient Medications   Medication Sig Dispense Refill    albuterol HFA 90 mcg/actuation inhaler INHALE 2 PUFFS EVERY SIX HOURS AS NEEDED FOR WHEEZING OR SHORTNESS OF BREATH. 8 g 1    alendronate (FOSAMAX) 70 MG tablet Take 1 tablet (70 mg total) by mouth every seven (7) days. 4 tablet 11    atorvastatin (LIPITOR) 10 MG tablet TAKE 1 TABLET BY MOUTH EVERY DAY 90 tablet 3    cyanocobalamin, vitamin B-12, (VITAMIN B-12 ORAL) Take by mouth.      DULoxetine (CYMBALTA) 30 MG capsule Take 1 capsule (30 mg total) by mouth two (2) times a day. 180 capsule 3    empty container Misc Use as directed to dispose of injectable medications 1 each 3    ergocalciferol-1,250 mcg, 50,000 unit, (DRISDOL) 1,250 mcg (50,000 unit) capsule Take 1 capsule (1,250 mcg total) by mouth once a week. 12 capsule 3    gabapentin (NEURONTIN) 600 MG tablet Take 2 capsules in the morning, 1 capsule in the afternoon and 1 capsule in the evening. Can take 1 extra capsule during the day if needed. 450 tablet 3    hydroCHLOROthiazide (HYDRODIURIL) 25 MG tablet Take 1 tablet (25 mg total) by mouth daily. 30 tablet 11    losartan (COZAAR) 100 MG tablet Take 1 tablet (100 mg total) by mouth daily. 90 tablet 3    multivitamin (TAB-A-VITE/THERAGRAN) per tablet Take 1 tablet by mouth daily.      ofatumumab (KESIMPTA PEN) 20 mg/0.4 mL PnIj Inject the contents of 1 pen subcutaneously every 4 weeks. 1.2 mL 3    traZODone (DESYREL) 50 MG tablet TAKE 1 TABLET BY MOUTH EVERY DAY AT NIGHT 90 tablet 1    losartan (COZAAR) 50 MG tablet Take 1 tablet (50 mg total) by mouth daily. (Patient not taking: Reported on 09/25/2022) 90 tablet 3     No current facility-administered medications for this visit.       Past Medical History:   Diagnosis Date    Asthma     COPD (chronic obstructive pulmonary disease) (CMS-HCC)     CVA (cerebral vascular accident) (CMS-HCC)     Multiple sclerosis (CMS-HCC)     Tobacco use        Past Surgical History:   Procedure Laterality Date    CESAREAN SECTION      HYSTERECTOMY      OOPHORECTOMY      TOTAL ABDOMINAL HYSTERECTOMY Bilateral 2003       Social History     Socioeconomic History    Marital status: Widowed     Spouse name: None    Number of children: None    Years of education: None    Highest education level: None   Tobacco Use    Smoking status: Former     Current packs/day: 0.00     Types: Cigarettes     Quit date: 10/12/2019     Years since quitting:  3.4     Passive exposure: Never    Smokeless tobacco: Never   Vaping Use    Vaping status: Never Used   Substance and Sexual Activity    Alcohol use: Yes     Comment: occassional, social celebrations    Drug use: Never    Sexual activity: Not Currently     Social Drivers of Health     Financial Resource Strain: Low Risk  (09/25/2022)    Overall Financial Resource Strain (CARDIA)     Difficulty of Paying Living Expenses: Not hard at all   Food Insecurity: No Food Insecurity (09/25/2022)    Hunger Vital Sign     Worried About Running Out of Food in the Last Year: Never true     Ran Out of Food in the Last Year: Never true   Transportation Needs: No Transportation Needs (09/25/2022)    PRAPARE - Therapist, art (Medical): No     Lack of Transportation (Non-Medical): No   Physical Activity: Sufficiently Active (09/25/2022)    Exercise Vital Sign     Days of Exercise per Week: 7 days     Minutes of Exercise per Session: 30 min   Stress: No Stress Concern Present (09/25/2022)    [employer] of Occupational Health - Occupational Stress Questionnaire     Feeling of Stress : Not at all   Social Connections: Moderately Integrated (09/25/2022)    Social Connection and Isolation Panel [NHANES]     Frequency of Communication with Friends and Family: More than three times a week     Frequency of Social Gatherings with Friends and Family: More than three times a week     Attends Religious Services: More than 4 times per year     Active Member of [employer] or Organizations: Yes     Attends Banker Meetings: More than 4 times per year     Marital Status: Widowed       Family History   Problem Relation Age of Onset    Breast cancer Mother 59    Cancer Mother         BREAST CANCER        Review of Systems     A 10-system review of systems was conducted and was negative except as documented above in the HPI.       Objective        Vital signs: BP 121/57 (BP Site: R Arm, BP Position: Sitting, BP Cuff Size: Medium)  - Pulse 86  - Temp 36.2 ??C (97.1 ??F) (Temporal)  - Ht 149.9 cm (4' 11)  - Wt 77.2 kg (170 lb 1.6 oz)  - BMI 34.36 kg/m??      Physical Exam:  General Appearance: well appearing. Normal skin color, afebrile.  Heart and lungs: Warm and well perfused. Normal work of breathing. Abdomen: Soft, non-tender.  Extremities:  2+ edema RLE to thigh    NEUROLOGICAL EXAMINATION:   General:  Alert and oriented to person, place, time and situation.    Recent and remote memory intact.    Attention span and concentration normal.    Language and spontaneous speech normal, no dysarthria or aphasia.   Fund of knowledge normal.    Cranial Nerves:   II, III- Pupils are equal and reactive to light b/l (direct and consensual reactions). No visual field defect.    III, IV, VI- extra ocular movements are intact, No ptosis, no diplopia, no nystagmus.  V- sensation of the face intact b/l.   VII- face symmetrical, no facial droop, normal facial movements with smile/grimace  VIII- Hearing grossly intact. Weber test: sound is symmetrical with no lateralization.  IX and X- symmetric palate contraction, no dysarthria, no dysphagia.  XI- Full shoulder shrug bilaterally; no wasting, normal tone and strength of sternocleidomastoid muscles bilaterally.  XII- No tongue atrophy, no tongue fasciculations; tongue protrudes midline, full range of movements of the tongue.    Neck: flexion normal.    Motor Exam:   Normal bulk. Fasciculations not observed.   Muscle strength:  RUE:  5/5 throughout  LUE:  5/5 throughout  RLE:  5/5 throughout  LLE:  5/5 throughout  McArdle sign negative.  Position test UE: no pronator drift b/l.     Reflexes R L   Biceps +2 brisk +2 brisk   Brachioradialis  +2 brisk +2brisk   Triceps +2brisk +2brisk   Patella +3 +3   Achilles +4 +4   4-5 beats clonus at ankles BL  Suprapatellar, crossed adductor positive BL.  Normal tone b/l. Negative Babinski and Hoffman sign bilaterally.    Sensory system:  Superficial light touch sensation: wnl bl UE/LE  Vibration sense:  Normal BLUE, RLE.  Absent left toe, normal left ankle.  Position sense: previously, wnl UE/LE  Pinprick test for pain sensation:  wnl bl UE.  Diminished right foot and left forefoot.    Temperature: wnl UE/LE    Cerebellar/Coordination:  Rapid alternating movements, finger-to-nose and heel-to-shin  bilaterally demonstrate no abnormalities. No limb ataxia bilaterally. No gait ataxia. Romberg positive. Unable to perform tandem gait.    Gait:   Wide base, short stride, right foot turned out, slow unsteady turning.         Diagnostic Studies and Review of Records     EMG 06/28/21  Normal study. There is no electrodiagnostic evidence of large fiber polyneuropathy. Small fiber neuropathy is not assessed by these techniques.     MRI T-spine and c-spine w wo 06/25/2020  Spinal cord abnormal T6-7 with increased signal intensity in the dorsal columns on noncontrast sequence and foci of enhancement at T3-4 and T6-7. Recommend comparison with prior imaging in order to determine if this is the same location as prior abnormality felt to be related to B12 deficiency.     1. Faint, symmetric linear signal throughout the cervical spine suggestive of subacute combined degeneration/B12 deficiency. Please see also T-S report.   2. Multilevel neural foraminal narrowing, moderate and greatest bilaterally at C3-4 on the right and C5-C6.   3. Multilevel degenerative disc disease, moderate and greatest at C5-C6 with associated moderate central canal stenosis.     MRI full neuro-axis w wo 10/23/21  Similar appearance of T2 signal abnormality within the thoracic cord from T7 through T8. This is indeterminate in nature and could represent sequela of prior insult or demyelinating lesion.      Previous enhancing foci are not visualized in this study.     Impression   Multiple bilateral subcortical and periventricular white matter foci of T2/FLAIR hyperintensity in a pattern consistent with multiple sclerosis.     MRI full Neuro-axis w/wo 08/2022  --Unchanged extent of the cord signal abnormality extending from T6-T8. No new cord signal abnormality or abnormal enhancement.   --No definite cervical spinal cord signal abnormality although evaluation is somewhat limited due to motion artifact.   Stable findings of multiple sclerosis. No new or enhancing lesions.      Stable mild  global cerebral atrophy.

## 2023-03-27 NOTE — Unmapped (Addendum)
 You were seen in [facility] Neurology clinic today for MS follow up.    The plan is:  - MRI brain and spine around July   [facility] ordered today  - Continue Kesimpta  - vitamin D, change to daily 4000 units over the counter  - cymbalta, try increasing to 60mg  in the mornings and 30mg  at evening/night  - continue gabapentin, okay to take extra pills during a flare  - trial capsaicin cream during flares.  Apply to painful areas while wearing latex gloves.    If you have any questions, the best way to contact me is by sending a MyChart message, but you can always call the office and leave a message for me at [phone].    Prudy Feeler MD  [facility] Neurology Resident

## 2023-03-28 MED ORDER — GABAPENTIN 600 MG TABLET
ORAL_TABLET | 5 refills | 0.00 days | Status: CP
Start: 2023-03-28 — End: ?

## 2023-03-28 MED ORDER — DULOXETINE 30 MG CAPSULE,DELAYED RELEASE
ORAL_CAPSULE | 5 refills | 0.00 days | Status: CP
Start: 2023-03-28 — End: ?

## 2023-03-28 MED ORDER — CAPSAICIN 0.1 % TOPICAL CREAM
5 refills | 0.00 days | Status: CP
Start: 2023-03-28 — End: ?

## 2023-03-28 NOTE — Unmapped (Signed)
 Addended by: Harvie Bridge on: 03/28/2023 09:23 PM     Modules accepted: Orders

## 2023-03-30 DIAGNOSIS — I10 Essential (primary) hypertension: Principal | ICD-10-CM

## 2023-03-30 MED ORDER — LOSARTAN 100 MG TABLET
ORAL_TABLET | Freq: Every day | ORAL | 3 refills | 0.00 days
Start: 2023-03-30 — End: ?

## 2023-03-30 NOTE — Unmapped (Signed)
 External images requested from [facility] upon request of Dr. Lenise Arena.  Images will be uploaded into Nuance/Powershare, and requested images be imported into Epic.

## 2023-03-31 ENCOUNTER — Inpatient Hospital Stay: Admit: 2023-03-31 | Discharge: 2023-04-01 | Payer: MEDICARE

## 2023-03-31 LAB — VITAMIN D 25 HYDROXY: VITAMIN D, TOTAL (25OH): 49.5 ng/mL (ref 20.0–80.0)

## 2023-03-31 MED ORDER — LOSARTAN 100 MG TABLET
ORAL_TABLET | Freq: Every day | ORAL | 3 refills | 90.00 days
Start: 2023-03-31 — End: ?

## 2023-03-31 NOTE — Unmapped (Signed)
 Request too soon, please send request closer to fill date.

## 2023-04-02 DIAGNOSIS — Z1231 Encounter for screening mammogram for malignant neoplasm of breast: Principal | ICD-10-CM

## 2023-04-08 ENCOUNTER — Ambulatory Visit: Admit: 2023-04-08 | Payer: MEDICARE

## 2023-04-19 NOTE — Unmapped (Signed)
 [facility] Specialty and Home Delivery Pharmacy Refill Coordination Note    Jacquese Cassarino, DOB: 09/22/1955  Phone: There are no phone numbers on file.      All above HIPAA information was verified with patient.         04/17/2023    11:30 AM   Specialty Rx Medication Refill Questionnaire   Which Medications would you like refilled and shipped? Kesimpta Pen 20mg    If medication refills are not needed at this time, please indicate the reason below. No   Please list all current allergies: Codeine/ Pencillen   Have you missed any doses in the last 30 days? No   Have you had any changes to your medication(s) since your last refill? No   How many days remaining of each medication do you have at home? None   If receiving an injectable medication, next injection date is 04/26/2023   Have you experienced any side effects in the last 30 days? No   Please enter the full address (street address, city, state, zip code) where you would like your medication(s) to be delivered to. [address] [place] [address]   Please specify on which day you would like your medication(s) to arrive. Note: if you need your medication(s) within 3 days, please call the pharmacy to schedule your order at [phone]  04/24/2023   Has your insurance changed since your last refill? No   Would you like a pharmacist to call you to discuss your medication(s)? No   Do you require a signature for your package? (Note: if we are billing Medicare Part B or your order contains a controlled substance, we will require a signature) No   Additional Comments: None         Completed refill call assessment today to schedule patient's medication shipment from the [facility] Special[facility] and Home Delivery Pharmacy [phone]).  All relevant notes have been reviewed.       Confirmed patient received a Conservation officer, historic buildings and a Surveyor, mining with first shipment. The patient will receive a drug information handout for each medication shipped and additional FDA Medication Guides as required.         REFERRAL TO PHARMACIST     Referral to the pharmacist: Not needed      [facility]     Shipping address confirmed in Epic.     Delivery Scheduled: Yes, Expected medication delivery date: 04/24/23.     Medication will be delivered via Same Day Courier to the prescription address in Epic WAM.    Tobi Bastos, PharmD   [facility] Specialty and Home Delivery Pharmacy Specialty Pharmacist

## 2023-04-24 MED FILL — KESIMPTA PEN 20 MG/0.4 ML SUBCUTANEOUS PEN INJECTOR: 28 days supply | Qty: 0.4 | Fill #7

## 2023-05-07 MED ORDER — ALBUTEROL SULFATE HFA 90 MCG/ACTUATION AEROSOL INHALER
1 refills | 0 days | Status: CP
Start: 2023-05-07 — End: ?

## 2023-05-07 NOTE — Unmapped (Signed)
 Unable to complete refill request, inhaler has been refilled within the last twelve months and requires provider review and approval.  Please advise on how to proceed.    Patient is requesting the following refill  Requested Prescriptions     Pending Prescriptions Disp Refills    albuterol HFA 90 mcg/actuation inhaler [Pharmacy Med Name: ALBUTEROL HFA (PROAIR) INHALER]  1     Sig: INHALE 2 PUFFS EVERY 6 HOURS AS NEEDED FOR WHEEZE OR FOR SHORTNESS OF BREATH       Recent Visits  Date Type Provider Dept   12/24/22 Office Visit Payton Mccallum, MD [facility] [address] [facility]   09/03/22 Office Visit Payton Mccallum, MD [facility] [address] [facility]   05/30/22 Office Visit Payton Mccallum, MD [facility] [address] [place]   Showing recent visits within past 365 days and meeting all other requirements  Future Appointments  Date Type Provider Dept   06/23/23 Appointment Payton Mccallum, MD [facility] [address] [place]   Showing future appointments within next 365 days and meeting all other requirements       Labs: Not applicable this refill

## 2023-05-18 NOTE — Unmapped (Signed)
 [facility] Specialty and Home Delivery Pharmacy Refill Coordination Note    Catherine Campos, DOB: 1955/02/20  Phone: There are no phone numbers on file.      All above HIPAA information was verified with patient.         05/14/2023     7:30 PM   Specialty Rx Medication Refill Questionnaire   Which Medications would you like refilled and shipped? Kesimpta Pen 20mg    If medication refills are not needed at this time, please indicate the reason below. No   Please list all current allergies: Codeine/Pencillen   Have you missed any doses in the last 30 days? No   Have you had any changes to your medication(s) since your last refill? No   How many days remaining of each medication do you have at home? 13   If receiving an injectable medication, next injection date is 05/27/2023   Have you experienced any side effects in the last 30 days? No   Please enter the full address (street address, city, state, zip code) where you would like your medication(s) to be delivered to. [address] [place]  [address]   Please specify on which day you would like your medication(s) to arrive. Note: if you need your medication(s) within 3 days, please call the pharmacy to schedule your order at [phone]  05/25/2023   Has your insurance changed since your last refill? Yes   Would you like a pharmacist to call you to discuss your medication(s)? No   Do you require a signature for your package? (Note: if we are billing Medicare Part B or your order contains a controlled substance, we will require a signature) No   I have been provided my out of pocket cost for my medication and approve the pharmacy to charge the amount to my credit card on file. Yes   Additional Comments: None         Completed refill call assessment today to schedule patient's medication shipment from the [facility] Special[facility] and Home Delivery Pharmacy [phone]).  All relevant notes have been reviewed.       Confirmed patient received a Conservation officer, historic buildings and a Surveyor, mining with first shipment. The patient will receive a drug information handout for each medication shipped and additional FDA Medication Guides as required.         REFERRAL TO PHARMACIST     Referral to the pharmacist: Not needed      [facility]     Shipping address confirmed in Epic.     Delivery Scheduled: Yes, Expected medication delivery date: 05/25/23.     Medication will be delivered via Same Day Courier to the prescription address in Epic WAM.    Arno Lapidus   [facility] Specialty and Home Delivery Pharmacy Specialty Technician

## 2023-05-25 MED FILL — KESIMPTA PEN 20 MG/0.4 ML SUBCUTANEOUS PEN INJECTOR: 28 days supply | Qty: 0.4 | Fill #8

## 2023-06-15 NOTE — Unmapped (Unsigned)
 Assessment and Plan:     There are no diagnoses linked to this encounter.  Multiple diagnoses above reviewed with patient  Continue current medication management  Rx as per orders; reviewed risks/benefits, possible side effects and patient verbalizes understanding  Referrals/labs as per orders above  Recommend supportive treatment with lifestyle modifications with diet/exercise  Follow up in 3-4 months or sooner prn      Barriers to recommended plan: {barrierstocare:74100}    I provided an intervention for the {SDOH RUEAVW:09811} SDOH domain. The intervention was {SDOH INTERVENTION:69735}     PHQ-2 Score:      PHQ-9 Score:      {select_status_or_delete_smartlist:64641}      I personally spent *** minutes face-to-face and non-face-to-face in the care of this patient, which includes all pre, intra, and post visit time (reviewing, evaluating and discussing pertinent records for patient's care) on the date of service.     No follow-ups on file.    Subjective:     HPI: Catherine Campos is a 68 y.o. female here for follow up.    Hypertension  Patient with history of hypertension manages with hydrochlorothiazide 25 mg daily and losartan 100 mg daily. Blood pressure at triage today ***. Denies CP, SOB, L/D or peripheral edema.    BP Readings from Last 3 Encounters:   03/27/23 121/57   12/24/22 114/68   09/03/22 146/76     MS / neuropathy  Taking gabapentin 1200 mg qAM, 600 mg noon, and 600 mg qPM and Cymbalta 30 mg daily.        HPI       ROS:   Review of Systems     Review of systems negative unless otherwise noted as per HPI.      The following portions of the patient's history were reviewed and updated as appropriate: allergies, current medications, past family history, past medical history, past social history, past surgical history and problem list.     Objective:     There were no vitals filed for this visit.  There is no height or weight on file to calculate BMI.    Physical Exam     Allergies:     Codeine and Penicillins    PCMH:     Medication adherence and barriers to the treatment plan have been addressed. Opportunities to optimize healthy behaviors have been discussed. Patient / caregiver voiced understanding.      I attest that I, Catherine Campos, personally documented this note while acting as scribe for Catherine Agar, MD.      [name], Scribe.  06/23/2023     The documentation recorded by the scribe accurately reflects the service I personally performed and the decisions made by me.    Catherine Agar, MD

## 2023-06-17 NOTE — Unmapped (Signed)
 [facility] Delivery Pharm[facility] Refill Coordination Note    Catherine Campos, DOB: 08/04/1955  Phone: There are no phone numbers on file.      All above HIPAA information was verified with patient.         06/17/2023    12:32 PM   Specialty Rx Medication Refill Questionnaire   Which Medications would you like refilled and shipped? Kesimpta Pen 20mg    If medication refills are not needed at this time, please indicate the reason below. No   Please list all current allergies: Codeine/Pencillen   Have you missed any doses in the last 30 days? No   Have you had any changes to your medication(s) since your last refill? No   How many days remaining of each medication do you have at home? None   If receiving an injectable medication, next injection date is 06/26/2023   Have you experienced any side effects in the last 30 days? No   Please enter the full address (street address, city, state, zip code) where you would like your medication(s) to be delivered to. [address] [place] [address]   Please specify on which day you would like your medication(s) to arrive. Note: if you need your medication(s) within 3 days, please call the pharmacy to schedule your order at [phone]  06/24/2023   Has your insurance changed since your last refill? No   Would you like a pharmacist to call you to discuss your medication(s)? No   Do you require a signature for your package? (Note: if we are billing Medicare Part B or your order contains a controlled substance, we will require a signature) No   I have been provided my out of pocket cost for my medication and approve the pharmacy to charge the amount to my credit card on file. Yes   Additional Comments: None         Completed refill call assessment today to schedule patient's medication shipment from the [facility] [phone]).  All relevant notes have been reviewed.       Confirmed patient received a Conservation officer, historic buildings and a Surveyor, mining with first shipment. The patient will receive a drug information handout for each medication shipped and additional FDA Medication Guides as required.         REFERRAL TO PHARMACIST     Referral to the pharmacist: Not needed      [name]     Shipping address confirmed in Epic.     Delivery Scheduled: Yes, Expected medication delivery date: 06/24/23 .     Medication will be delivered via Same Day Courier to the prescription address in Epic WAM.    [name]   [facility] Delivery Pharm[facility] Specialty Technician

## 2023-06-22 NOTE — Unmapped (Signed)
 Assessment and Plan:     Hypertension, unspecified type    Acute maxillary sinusitis, recurrence not specified  -     doxycycline hyclate; Take 1 capsule (100 mg total) by mouth two (2) times a day.    Conjunctivitis, unspecified conjunctivitis type, unspecified laterality  -     moxifloxacin; Administer 1 drop to both eyes Three (3) times a day.    Encounter for screening mammogram for malignant neoplasm of breast  -     Mammo Digital Screening Bilateral; Future    Multiple sclerosis        Multiple diagnoses above reviewed with patient  Blood pressure well controlled at triage. Continue to monitor.   Suspect cough likely 2/2 postnasal drip.   Continue current medication management  Rx as per orders; reviewed risks/benefits, possible side effects and patient verbalizes understanding  Colonoscopy ordered. Number given in AVS for patient to call and schedule.   Recommend supportive treatment with lifestyle modifications with diet/exercise  Follow up in 6 months or sooner as needed      Barriers to recommended plan: None identified    I personally spent 30 minutes face-to-face and non-face-to-face in the care of this patient, which includes all pre, intra, and post visit time (reviewing, evaluating and discussing pertinent records for patient's care) on the date of service.     Return in about 6 months (around 01/01/2024) for Recheck.    Subjective:     HPI: Catherine Campos is a 68 y.o. female here for follow up.    Hypertension  Patient with history of hypertension manages with hydrochlorothiazide 25 mg daily and losartan 100 mg daily. Blood pressure at triage today 108/69. Denies CP, SOB, L/D or peripheral edema.    BP Readings from Last 3 Encounters:   07/01/23 108/69   03/27/23 121/57   12/24/22 114/68     Cough  Patient c/o productive cough, congestion x 4 days. Also c/o swollen, red, and crusty eyes and some sinus pressure. States prior to today her eyes looked like two tomatoes and felt very gritty. She has not had to use her inhaler. Denies ear pain. Patient quit smoking 8-9 years ago.     MS  On Kesimpta 20 mg monthly injection. Her most recent injection was Saturday.        HPI       ROS:   Review of Systems     Review of systems negative unless otherwise noted as per HPI.      The following portions of the patient's history were reviewed and updated as appropriate: allergies, current medications, past family history, past medical history, past social history, past surgical history and problem list.     Objective:     Vitals:    07/01/23 1459   BP: 108/69   Pulse: 91   Temp: 36.3 ??C (97.3 ??F)   SpO2: 97%     Body mass index is 34.09 kg/m??.    Physical Exam  Vitals and nursing note reviewed.   Constitutional:       General: She is not in acute distress.     Appearance: Normal appearance. She is not ill-appearing or toxic-appearing.   HENT:      Right Ear: Tympanic membrane, ear canal and external ear normal.      Left Ear: Tympanic membrane, ear canal and external ear normal.      Nose:      Right Sinus: Maxillary sinus tenderness and frontal sinus tenderness present.  Left Sinus: Maxillary sinus tenderness and frontal sinus tenderness present.      Mouth/Throat:      Mouth: Mucous membranes are moist.      Pharynx: Oropharynx is clear. No postnasal drip.   Eyes:      General: Lids are normal. Vision grossly intact.      Extraocular Movements: Extraocular movements intact.      Conjunctiva/sclera:      Right eye: Right conjunctiva is injected.      Left eye: Left conjunctiva is injected.   Cardiovascular:      Rate and Rhythm: Normal rate and regular rhythm.      Heart sounds: Normal heart sounds.   Pulmonary:      Effort: Pulmonary effort is normal. No respiratory distress.      Breath sounds: Normal breath sounds. No stridor. No wheezing, rhonchi or rales.   Chest:      Chest wall: No tenderness.   Musculoskeletal:      Cervical back: Neck supple.   Neurological:      Mental Status: She is alert. Allergies:     Codeine and Penicillins    PCMH:     Medication adherence and barriers to the treatment plan have been addressed. Opportunities to optimize healthy behaviors have been discussed. Patient / caregiver voiced understanding.      I attest that I, Zuling F Quade, personally documented this note while acting as scribe for Michal Agar, MD.      [name], Scribe.  07/01/2023     The documentation recorded by the scribe accurately reflects the service I personally performed and the decisions made by me.    Michal Agar, MD

## 2023-06-23 DIAGNOSIS — I10 Essential (primary) hypertension: Principal | ICD-10-CM

## 2023-06-23 MED ORDER — LOSARTAN 100 MG TABLET
ORAL_TABLET | Freq: Every day | ORAL | 1 refills | 90.00000 days | Status: CP
Start: 2023-06-23 — End: ?

## 2023-06-23 NOTE — Unmapped (Signed)
 Unable to complete refill request, due to duplicate therapy, please advise on how to proceed.  Please advise on how to proceed.

## 2023-06-24 DIAGNOSIS — G35 Multiple sclerosis: Principal | ICD-10-CM

## 2023-06-24 MED FILL — KESIMPTA PEN 20 MG/0.4 ML SUBCUTANEOUS PEN INJECTOR: SUBCUTANEOUS | 28 days supply | Qty: 0.4 | Fill #9

## 2023-07-01 ENCOUNTER — Ambulatory Visit: Admit: 2023-07-01 | Discharge: 2023-07-02 | Payer: Medicare (Managed Care)

## 2023-07-01 DIAGNOSIS — H109 Unspecified conjunctivitis: Principal | ICD-10-CM

## 2023-07-01 DIAGNOSIS — J0100 Acute maxillary sinusitis, unspecified: Principal | ICD-10-CM

## 2023-07-01 DIAGNOSIS — Z1231 Encounter for screening mammogram for malignant neoplasm of breast: Principal | ICD-10-CM

## 2023-07-01 DIAGNOSIS — I10 Essential (primary) hypertension: Principal | ICD-10-CM

## 2023-07-01 MED ORDER — MOXIFLOXACIN 0.5 % EYE DROPS
Freq: Three times a day (TID) | OPHTHALMIC | 0 refills | 20.00000 days | Status: CP
Start: 2023-07-01 — End: ?

## 2023-07-01 MED ORDER — DOXYCYCLINE HYCLATE 100 MG CAPSULE
ORAL_CAPSULE | Freq: Two times a day (BID) | ORAL | 0 refills | 10.00000 days | Status: CP
Start: 2023-07-01 — End: ?

## 2023-07-01 NOTE — Unmapped (Signed)
Colonoscopy with [facility] [phone]

## 2023-07-19 DIAGNOSIS — F5101 Primary insomnia: Principal | ICD-10-CM

## 2023-07-19 MED ORDER — TRAZODONE 50 MG TABLET
ORAL_TABLET | Freq: Every evening | ORAL | 1 refills | 0.00000 days
Start: 2023-07-19 — End: ?

## 2023-07-20 MED ORDER — TRAZODONE 50 MG TABLET
ORAL_TABLET | Freq: Every evening | ORAL | 1 refills | 90.00000 days
Start: 2023-07-20 — End: ?

## 2023-07-20 NOTE — Unmapped (Signed)
 Request too soon, please send request closer to fill date.

## 2023-07-20 NOTE — Unmapped (Signed)
 [facility] Delivery Pharmacy Clinical Assessment & Refill Coordination Note    Tasheka Houseman, DOB: Oct 08, 1955  Phone: There are no phone numbers on file.    All above HIPAA information was verified with patient.     Was a Nurse, learning disability used for this call? No    Specialty Medication(s):   Neurology: Kesimpta     Current Medications[1]     Changes to medications: Maryana reports no changes at this time.    Medication list has been reviewed and updated in Epic: Yes    Allergies[2]    Changes to allergies: No    Allergies have been reviewed and updated in Epic: Yes    SPECIALTY MEDICATION ADHERENCE     Kesimpta 20 mg/0.80ml: 0 doses of medicine on hand       Medication Adherence    Patient reported X missed doses in the last month: 0  Specialty Medication: Kesimpta  Patient is on additional specialty medications: No  Informant: patient          Specialty medication(s) dose(s) confirmed: Regimen is correct and unchanged.     Are there any concerns with adherence? No    Adherence counseling provided? Not needed    CLINICAL MANAGEMENT AND INTERVENTION      Clinical Benefit Assessment:    Do you feel the medicine is effective or helping your condition? Yes    Clinical Benefit counseling provided? Not needed    Adverse Effects Assessment:    Are you experiencing any side effects? No    Are you experiencing difficulty administering your medicine? No    Quality of Life Assessment:    Quality of Life    Rheumatology  Oncology  Dermatology  Cystic Fibrosis          How many days over the past month did your MS  keep you from your normal activities? For example, brushing your teeth or getting up in the morning. 0    Have you discussed this with your provider? Not needed    Acute Infection Status:    Acute infections noted within Epic:  No active infections    Patient reported infection: None    Therapy Appropriateness:    Is therapy appropriate based on current medication list, adverse reactions, adherence, clinical benefit and progress toward achieving therapeutic goals? Yes, therapy is appropriate and should be continued     Clinical Intervention:    Was an intervention completed as part of this clinical assessment? No    DISEASE/MEDICATION-SPECIFIC INFORMATION      For patients on injectable medications: Patient currently has 0 doses left.  Next injection is scheduled for 07/27/23.    Multiple Sclerosis: Have you experienced any flares in the last month? No  Has this been reported to your provider? Not applicable  What was the outcome of the flare? Not applicable    PATIENT SPECIFIC NEEDS     Does the patient have any physical, cognitive, or cultural barriers? No    Is the patient high risk? No    Does the patient require physician intervention or other additional services (i.e., nutrition, smoking cessation, social work)? No    Does the patient have an additional or emergency contact listed in their chart? Yes    SOCIAL DETERMINANTS OF HEALTH     At the [facility], we have learned that life circumstances - like trouble affording food, housing, utilities, or transportation can affect the health of many of our patients.  That is why we wanted to ask: are you currently experiencing any life circumstances that are negatively impacting your health and/or quality of life? Patient declined to answer    Social Drivers of Health     Food Insecurity: No Food Insecurity (09/25/2022)    Hunger Vital Sign     Worried About Running Out of Food in the Last Year: Never true     Ran Out of Food in the Last Year: Never true   Tobacco Use: Medium Risk (07/01/2023)    Patient History     Smoking Tobacco Use: Former     Smokeless Tobacco Use: Never     Passive Exposure: Never   Transportation Needs: No Transportation Needs (09/25/2022)    PRAPARE - Therapist, art (Medical): No     Lack of Transportation (Non-Medical): No   Alcohol Use: Not At Risk (09/25/2022)    Alcohol Use     How often do you have a drink containing alcohol?: Monthly or less     How many drinks containing alcohol do you have on a typical day when you are drinking?: 1 - 2     How often do you have 5 or more drinks on one occasion?: Never   Housing: Low Risk  (09/25/2022)    Housing     Within the past 12 months, have you ever stayed: outside, in a car, in a tent, in an overnight shelter, or temporarily in someone else's home (i.e. couch-surfing)?: No     Are you worried about losing your housing?: No   Physical Activity: Sufficiently Active (09/25/2022)    Exercise Vital Sign     Days of Exercise per Week: 7 days     Minutes of Exercise per Session: 30 min   Utilities: Low Risk  (09/25/2022)    Utilities     Within the past 12 months, have you been unable to get utilities (heat, electricity) when it was really needed?: No   Stress: No Stress Concern Present (09/25/2022)    [employer] of Occupational Health - Occupational Stress Questionnaire     Feeling of Stress : Not at all   Interpersonal Safety: Not At Risk (09/25/2022)    Interpersonal Safety     Unsafe Where You Currently Live: No     Physically Hurt by Anyone: No     Abused by Anyone: No   Substance Use: Low Risk  (09/25/2022)    Substance Use     In the past year, how often have you used prescription drugs for non-medical reasons?: Never     In the past year, how often have you used illegal drugs?: Never     In the past year, have you used any substance for non-medical reasons?: No   Intimate Partner Violence: Not At Risk (09/25/2022)    Humiliation, Afraid, Rape, and Kick questionnaire     Fear of Current or Ex-Partner: No     Emotionally Abused: No     Physically Abused: No     Sexually Abused: No   Social Connections: Moderately Integrated (09/25/2022)    Social Connection and Isolation Panel     Frequency of Communication with Friends and Family: More than three times a week     Frequency of Social Gatherings with Friends and Family: More than three times a week     Attends Religious Services: More than 4 times per year     Active Member of Clubs or  Organizations: Yes     Attends Engineer, structural: More than 4 times per year     Marital Status: Widowed   Physicist, medical Strain: Low Risk  (09/25/2022)    Overall Financial Resource Strain (CARDIA)     Difficulty of Paying Living Expenses: Not hard at all   Health Literacy: Low Risk  (09/25/2022)    Health Literacy     : Never   Internet Connectivity: No Internet connectivity concern identified (09/25/2022)    Internet Connectivity     Do you have access to internet services: Yes     How do you connect to the internet: Personal Device at home     Is your internet connection strong enough for you to watch video on your device without major problems?: Yes     Do you have enough data to get through the month?: Yes     Does at least one of the devices have a camera that you can use for video chat?: Yes       Would you be willing to receive help with any of the needs that you have identified today? Not applicable       SHIPPING     Specialty Medication(s) to be Shipped:   Neurology: Kesimpta    Other medication(s) to be shipped: No additional medications requested for fill at this time     Changes to insurance: No    Cost and Payment: Patient has a $0 copay, payment information is not required.    Delivery Scheduled: Yes, Expected medication delivery date: 07/23/23.     Medication will be delivered via Same Day Courier to the confirmed prescription address in [facility].    The patient will receive a drug information handout for each medication shipped and additional FDA Medication Guides as required.  Verified that patient has previously received a Conservation officer, historic buildings and a Surveyor, mining.    The patient or caregiver noted above participated in the development of this care plan and knows that they can request review of or adjustments to the care plan at any time.      All of the patient's questions and concerns have been addressed.    Milagros Alf, PharmD   [facility] Delivery Pharm[facility] Specialty Pharmacist       [1]   Current Outpatient Medications   Medication Sig Dispense Refill    albuterol HFA 90 mcg/actuation inhaler INHALE 2 PUFFS EVERY 6 HOURS AS NEEDED FOR WHEEZE OR FOR SHORTNESS OF BREATH 8 g 1    alendronate (FOSAMAX) 70 MG tablet Take 1 tablet (70 mg total) by mouth every seven (7) days. 4 tablet 11    atorvastatin (LIPITOR) 10 MG tablet TAKE 1 TABLET BY MOUTH EVERY DAY 90 tablet 3    capsaicin 0.1 % topical cream Apply to painful areas on legs as needed.  Wear gloves when applying. 42.5 g 5    cyanocobalamin, vitamin B-12, (VITAMIN B-12 ORAL) Take by mouth.      doxycycline (VIBRAMYCIN) 100 MG capsule Take 1 capsule (100 mg total) by mouth two (2) times a day. 20 capsule 0    DULoxetine (CYMBALTA) 30 MG capsule Take 60mg  (2 pills) in the morning and 30mg  (one pill) at night 270 capsule 5    empty container Misc Use as directed to dispose of injectable medications 1 each 3    gabapentin (NEURONTIN) 600 MG tablet Take 2 capsules in the morning, 1 capsule in  the afternoon and 1 capsule in the evening. Can take 1 extra capsule during the day if needed. 450 tablet 5    hydroCHLOROthiazide (HYDRODIURIL) 25 MG tablet Take 1 tablet (25 mg total) by mouth daily. 30 tablet 11    losartan (COZAAR) 100 MG tablet TAKE 1 TABLET BY MOUTH EVERY DAY 90 tablet 1    losartan (COZAAR) 50 MG tablet Take 1 tablet (50 mg total) by mouth daily. (Patient not taking: Reported on 09/25/2022) 90 tablet 3    moxifloxacin (VIGAMOX) 0.5 % ophthalmic solution Administer 1 drop to both eyes Three (3) times a day. 3 mL 0    multivitamin (TAB-A-VITE/THERAGRAN) per tablet Take 1 tablet by mouth daily.      ofatumumab (KESIMPTA PEN) 20 mg/0.4 mL PnIj Inject the contents of 1 pen subcutaneously every 4 weeks. 1.2 mL 3    traZODone (DESYREL) 50 MG tablet TAKE 1 TABLET BY MOUTH EVERY DAY AT NIGHT 90 tablet 1     No current facility-administered medications for this visit.   [2]   Allergies  Allergen Reactions    Codeine Hives and Swelling     Tongue swelling    Penicillins Hives and Swelling     Has patient had a PCN reaction causing immediate rash, facial/tongue/throat swelling, SOB or lightheadedness with hypotension: Yes  Has patient had a PCN reaction causing severe rash involving mucus membranes or skin necrosis: No  Has patient had a PCN reaction that required hospitalization No  Has patient had a PCN reaction occurring within the last 10 years: Yes  If all of the above answers are NO, then may proceed with Cephalosporin use.

## 2023-07-23 MED FILL — KESIMPTA PEN 20 MG/0.4 ML SUBCUTANEOUS PEN INJECTOR: SUBCUTANEOUS | 28 days supply | Qty: 0.4 | Fill #10

## 2023-08-12 NOTE — Unmapped (Signed)
 [facility] Special[facility] and Home Delivery Pharmacy Refill Coordination Note    Catherine Campos, DOB: 08/07/1955  Phone: There are no phone numbers on file.      All above HIPAA information was verified with patient.         08/12/2023     1:03 PM   Specialty Rx Medication Refill Questionnaire   Which Medications would you like refilled and shipped? None   If medication refills are not needed at this time, please indicate the reason below. No   Please list all current allergies: Codeine/Pencillen   Have you missed any doses in the last 30 days? No   Have you had any changes to your medication(s) since your last refill? No   How many days remaining of each medication do you have at home? None   If receiving an injectable medication, next injection date is 08/26/2023   Have you experienced any side effects in the last 30 days? No   Please enter the full address (street address, city, state, zip code) where you would like your medication(s) to be delivered to. [address] [place]  [address]   Please specify on which day you would like your medication(s) to arrive. Note: if you need your medication(s) within 3 days, please call the pharmacy to schedule your order at [phone]  08/24/2023   Has your insurance changed since your last refill? No   Would you like a pharmacist to call you to discuss your medication(s)? No   Do you require a signature for your package? (Note: if we are billing Medicare Part B or your order contains a controlled substance, we will require a signature) No   I have been provided my out of pocket cost for my medication and approve the pharmacy to charge the amount to my credit card on file. Yes   Additional Comments: None         Completed refill call assessment today to schedule patient's medication shipment from the [facility] Delivery Pharm[facility] [phone]).  All relevant notes have been reviewed.       Confirmed patient received a Conservation officer, historic buildings and a Surveyor, mining with first shipment. The patient will receive a drug information handout for each medication shipped and additional FDA Medication Guides as required.         REFERRAL TO PHARMACIST     Referral to the pharmacist: Not needed      [facility]     Shipping address confirmed in Epic.     Delivery Scheduled: Yes, Expected medication delivery date: 08/24/23 .     Medication will be delivered via Same Day Courier to the prescription address in Epic WAM.    [name]   [facility] Special[facility] and Home Delivery Pharmacy Specialty Technician

## 2023-08-24 MED FILL — KESIMPTA PEN 20 MG/0.4 ML SUBCUTANEOUS PEN INJECTOR: SUBCUTANEOUS | 28 days supply | Qty: 0.4 | Fill #11

## 2023-08-28 DIAGNOSIS — I10 Essential (primary) hypertension: Principal | ICD-10-CM

## 2023-08-28 DIAGNOSIS — F5101 Primary insomnia: Principal | ICD-10-CM

## 2023-08-28 DIAGNOSIS — M810 Age-related osteoporosis without current pathological fracture: Principal | ICD-10-CM

## 2023-08-28 MED ORDER — HYDROCHLOROTHIAZIDE 25 MG TABLET
ORAL_TABLET | Freq: Every day | ORAL | 3 refills | 0.00000 days
Start: 2023-08-28 — End: ?

## 2023-08-28 MED ORDER — TRAZODONE 50 MG TABLET
ORAL_TABLET | Freq: Every evening | ORAL | 1 refills | 0.00000 days
Start: 2023-08-28 — End: ?

## 2023-08-28 MED ORDER — ALENDRONATE 70 MG TABLET
ORAL_TABLET | 3 refills | 0.00000 days
Start: 2023-08-28 — End: ?

## 2023-08-31 MED ORDER — HYDROCHLOROTHIAZIDE 25 MG TABLET
ORAL_TABLET | Freq: Every day | ORAL | 3 refills | 90.00000 days | Status: CP
Start: 2023-08-31 — End: ?

## 2023-08-31 MED ORDER — ALENDRONATE 70 MG TABLET
ORAL_TABLET | 3 refills | 0.00000 days
Start: 2023-08-31 — End: ?

## 2023-08-31 MED ORDER — TRAZODONE 50 MG TABLET
ORAL_TABLET | Freq: Every evening | ORAL | 1 refills | 90.00000 days
Start: 2023-08-31 — End: ?

## 2023-08-31 NOTE — Unmapped (Signed)
 Patient is requesting the following refill  Requested Prescriptions     Pending Prescriptions Disp Refills    alendronate  (FOSAMAX ) 70 MG tablet [Pharmacy Med Name: ALENDRONATE  SODIUM 70 MG TAB] 12 tablet 3     Sig: TAKE 1 TABLET BY MOUTH EVERY SEVEN DAYS.    traZODone  (DESYREL ) 50 MG tablet [Pharmacy Med Name: TRAZODONE  50 MG TABLET] 90 tablet 1     Sig: TAKE 1 TABLET BY MOUTH EVERY DAY AT NIGHT     Signed Prescriptions Disp Refills    hydroCHLOROthiazide  (HYDRODIURIL ) 25 MG tablet 90 tablet 3     Sig: TAKE 1 TABLET (25 MG TOTAL) BY MOUTH DAILY.     Authorizing Provider: SERVANDO HIRE     Ordering User: SHLOMO ELYN HERO       Recent Visits  Date Type Provider Dept   07/01/23 Office Visit SERVANDO HIRE, MD [facility] [address] [facility]   12/24/22 Office Visit SERVANDO HIRE, MD [facility] [address] [facility]   09/03/22 Office Visit SERVANDO HIRE, MD [facility] [address] [place]   Showing recent visits within past 365 days and meeting all other requirements  Future Appointments  Date Type Provider Dept   01/01/24 Appointment SERVANDO HIRE, MD [facility] [address] [place]   Showing future appointments within next 365 days and meeting all other requirements       Labs: Potassium:   Potassium (mmol/L)   Date Value   12/24/2022 3.8    Vitals:   BP Readings from Last 3 Encounters:   07/01/23 108/69   03/27/23 121/57   12/24/22 114/68    and   Pulse Readings from Last 3 Encounters:   07/01/23 91   03/27/23 86   12/24/22 80    Vitamin D :   Vitamin D  Total (25OH) (ng/mL)   Date Value   03/27/2023 49.5

## 2023-09-01 MED ORDER — ALENDRONATE 70 MG TABLET
ORAL_TABLET | 3 refills | 0.00000 days | Status: CP
Start: 2023-09-01 — End: ?

## 2023-09-01 MED ORDER — TRAZODONE 50 MG TABLET
ORAL_TABLET | Freq: Every evening | ORAL | 1 refills | 90.00000 days | Status: CP
Start: 2023-09-01 — End: ?

## 2023-09-15 DIAGNOSIS — G35 Multiple sclerosis: Principal | ICD-10-CM

## 2023-09-15 MED ORDER — KESIMPTA PEN 20 MG/0.4 ML SUBCUTANEOUS PEN INJECTOR
3 refills | 0.00000 days
Start: 2023-09-15 — End: ?

## 2023-09-16 MED ORDER — KESIMPTA PEN 20 MG/0.4 ML SUBCUTANEOUS PEN INJECTOR
SUBCUTANEOUS | 3 refills | 0.00000 days | Status: CP
Start: 2023-09-16 — End: ?
  Filled 2023-09-23: qty 0.4, 28d supply, fill #0

## 2023-09-16 NOTE — Unmapped (Signed)
 Refill request received from patient.      Medication Requested: Kesimpta  20 mg/0.4 mL  Last Office Visit: 03/27/2023   Next Office Visit: 02/25/2024  Last Prescriber: Dr. Nanci     Nurse refill requirements met? No  If not met, why: Medication not on active SOP//outside CMA scope to deny or approve refill request     Sent to: Provider for signing  If sent to provider, which provider?: Dr. Nanci

## 2023-09-17 DIAGNOSIS — G629 Polyneuropathy, unspecified: Principal | ICD-10-CM

## 2023-09-17 MED ORDER — DULOXETINE 30 MG CAPSULE,DELAYED RELEASE
ORAL_CAPSULE | 5 refills | 0.00000 days
Start: 2023-09-17 — End: ?

## 2023-09-17 NOTE — Unmapped (Signed)
 [facility] Refill Coordination Note    Catherine Campos, DOB: Jun 22, 1955  Phone: There are no phone numbers on file.      All above HIPAA information was verified with patient.         09/15/2023     3:04 PM   Specialty Rx Medication Refill Questionnaire   Which Medications would you like refilled and shipped? K   If medication refills are not needed at this time, please indicate the reason below. Kesimpta    Please list all current allergies: Codeine/Pencillen   Have you missed any doses in the last 30 days? No   Have you had any changes to your medication(s) since your last refill? No   How much of each medication do you have remaining at home? (eg. number of tablets, injections, etc.) None   If receiving an injectable medication, next injection date is 09/26/2023   Have you experienced any side effects in the last 30 days? No   Please enter the full address (street address, city, state, zip code) where you would like your medication(s) to be delivered to. [address] [place] [address]   Please specify on which day you would like your medication(s) to arrive. Note: if you need your medication(s) within 3 days, please call the pharmacy to schedule your order at [phone]  09/24/2023   Has your insurance changed since your last refill? No   Would you like a pharmacist to call you to discuss your medication(s)? No   Do you require a signature for your package? (Note: if we are billing Medicare Part B or your order contains a controlled substance, we will require a signature) No   I have been provided my out of pocket cost for my medication and approve the pharmacy to charge the amount to my credit card on file. Yes   Additional Comments: None         Completed refill call assessment today to schedule patient's medication shipment from the [facility] [phone]).  All relevant notes have been reviewed.       Confirmed patient received a Conservation officer, historic buildings and a Surveyor, mining with first shipment. The patient will receive a drug information handout for each medication shipped and additional FDA Medication Guides as required.         REFERRAL TO PHARMACIST     Referral to the pharmacist: Not needed      [facility]     Shipping address confirmed in Epic.     Delivery Scheduled: Yes, Expected medication delivery date: 09/24/23 .     Medication will be delivered via Next Day Courier to the prescription address in Epic WAM.    [name]   [facility] Specialty Technician

## 2023-09-17 NOTE — Unmapped (Signed)
 Refill request received from patient.      Medication Requested: Duloxetine  30 mg     Last Office Visit: 03/27/2023   Next Office Visit: 02/25/2024  Last Prescriber: Dr. Nanci     Nurse refill requirements met? No  If not met, why: Medication not on active SOP//outside CMA scope to deny or approve refill request     Sent to: Provider for signing  If sent to provider, which provider?: Dr. Nanci

## 2023-09-18 MED ORDER — DULOXETINE 30 MG CAPSULE,DELAYED RELEASE
ORAL_CAPSULE | ORAL | 5 refills | 0.00000 days | Status: CP
Start: 2023-09-18 — End: ?

## 2023-09-21 NOTE — Unmapped (Signed)
-----   Message from Ratechia H sent at 09/21/2023  8:08 AM EDT -----  Regarding: RE: follow up  Hi,    I have contacted the patient and the appointment is scheduled for a Sept appt.     Thanks!    Ratechia Hamlett  ----- Message -----  From: Nanci Comer PARAS, MD  Sent: 09/18/2023   5:50 PM EDT  To: [facility] Scheduling [facility] Referrals [facility]  Subject: follow up                                        Hi - Patient is scheduled with me in January but she is due now for 6 month follow up for MS.  Can you please call her and offer any of my urgent slots prior to that - September is preferable.    Thank you,  Kit    Comer Nanci MD  Resident Physician [facility] Department of Neurology

## 2023-09-26 DIAGNOSIS — F5101 Primary insomnia: Principal | ICD-10-CM

## 2023-09-26 MED ORDER — ALBUTEROL SULFATE HFA 90 MCG/ACTUATION AEROSOL INHALER
1 refills | 0.00000 days
Start: 2023-09-26 — End: ?

## 2023-09-26 MED ORDER — TRAZODONE 50 MG TABLET
ORAL_TABLET | Freq: Every evening | ORAL | 1 refills | 0.00000 days
Start: 2023-09-26 — End: ?

## 2023-09-28 MED ORDER — TRAZODONE 50 MG TABLET
ORAL_TABLET | Freq: Every evening | ORAL | 1 refills | 270.00000 days
Start: 2023-09-28 — End: ?

## 2023-09-28 MED ORDER — ALBUTEROL SULFATE HFA 90 MCG/ACTUATION AEROSOL INHALER
RESPIRATORY_TRACT | 0 refills | 0.00000 days | Status: CP
Start: 2023-09-28 — End: ?

## 2023-09-28 NOTE — Unmapped (Signed)
 Request too soon, please send request closer to fill date.

## 2023-10-12 NOTE — Unmapped (Signed)
 Outpatient Neurology Consult Note     [facility]   [address]  [place] [address]  [phone]    Date: 10/23/2023  Patient Name: Catherine Campos  MRN: [id number]  PCP: Servando Hire  Pcp, Unknown Per Patient     Assessment and Plan        [name] is a 68 y.o. female with a past medical history of COPD, HTN, HLD, and B12 deficiency presenting for follow up of BLLE paresthesias and RRMS.    #RRMS  Met McDonald criteria at age 56 with >=2 lesions (enhancing thoracic spinal cord lesions in 2022, subcortical and periventricular white matter brain lesions in 2023) and >=2 clinical relapses of BLLE paresthesia starting in 2022, however first clinical episode was likely in 2018 with episode of dizziness, right-sided weakness and numbness, blurred vision for 2-3 days diagnosed as TIA with MRI brain wo contrast showing scattered subcortical lesions.  MS mimickers were negative (serum testing).  Symptoms stable and minimal; main complaint is paroxysmal distal BLLE dysesthesia.  Has lower extremity hyperreflexia, distal sensory loss, gait imbalance.  Surveillance MRI neuro-axis 08/2022 stable without new or enhancing lesions.  Plan to continue MS therapy with Kesimpta  started 01/2022 and treat neuropathic type pain symptomatically.      Plan:  - Continue Kesimpta  monthly injections  - For neuropathic pain:  Continue duloxetine  and gabapentin .  Trial low dose naltrexone  titrated to 4.5 mg as tolerated.   - considering referral to pain management and/or alternate treatments  - Continue Vitamin D  OTC 4000 units daily    - Annual surveillance MRI brain, c-spine, t-spine w/wo ordered     - 6 month monitoring labs today:  CBC w diff, IgM, IgG, vitamin D   - Referral to PT for balance  - Order for orthopedic shoes    Return Visit Discussed: Return in about 6 months (around 04/21/2024).      This patient was seen and discussed with Dr. Aram who agrees with the above assessment and plan.     I personally spent 47 minutes face-to-face and non-face-to-face in the care of this patient, which includes all pre, intra, and post visit time on the date of service.  All documented time was specific to the E/M visit and does not include any procedures that may have been performed.     Comer Gleason MD  Resident Physician [facility] Department of Neurology        HPI         HPI 06/07/20: Ms. Catherine Campos is a 68 y.o. right handed female seen in consultation at the [facility] at the request of Dr. Freddrick for evaluation of bilateral pedal numbness.  PMH COPD, HTN, HLD and B12 deficiency.    1 month history of bilateral foot numbness. First started on the right foot up to the ankles then it progressed to the left foot. She went to her PCP and was noted to have low B12 level and she underwent B12 shots for 4 weeks but symptoms did not improve. She is also currently taking Duloxetine  30mg  BID but with no improvement in her symptoms. She describes the sensation as heaviness, pounding and sand like feeling on both of them.  No burning sensation. No pain. No prior history. Retired, work previously as a Lawyer. No recent weight loss.  Glass of wine 2x/weekly.  No hx of DM.  Recent blood work at PCP on 04/2020  A1c 5.3, TSH 0.499(wnl) B12 173 but repleted.    Interval History summary from 06/2020-on:  -Spinal MRI 06/2020 consistent with subacute combined degeneration and degenerative disease.    -EMG completed 06/2021 without evidence of large fiber neuropathy.     -Labs from 07/2020:  B12 892, B1 nl, B6 nl, Homocysteine hi 20, MMA nl, Gliadin IgA high 25, Gliadin IgG nl, SPEP nl, CRP nl, ESR nl, ENA nl, ANA positive 1:80, Copper nl.  Next bloodwork scheduled for Oct 2023.  -Pt has been compliant with B12, B6 and multivitamin  -Symptoms have gotten worse, with numbness to just below her knees, also gets a tight feeling in the same region.  Feels like she's walking on sponges or sand.  2-3 months ago started having days where her toes and feet hurt with throbbing or burning or pins and needles pain to the point she cannot put socks on.  Now needs to walk with a cane because she sometimes feels unsteady with walking.  No falls.  Able to feel temperature and pain.  -Clarifying history:  Symptoms started March 2022 with numbness in BL lower legs.  She had previously reported worsening of symptoms over time.  But she actually had relapsing remitting symptoms since onset in with weeks at normal baseline and then getting slammed with symptoms out of the blue lasting a few weeks.  Numbness has evolved over time to numbness and pain in BL lower legs.  Has had at least 3 relapses.  -Per chart review, found episode in 05/2016 with dizziness, right-sided weakness and numbness and blurred vision for 2-3 days.  Patient recalls this event and says she was diagnosed with a TIA.  Symptoms never recurred.  MRI brain wo with scattered subcortical lesions read as chronic microvascular disease.  - MRI brain in Sept 2023 with multiple subcortical and periventricular white matter lesions; MRI spine now with non-enhancing lesions that were previously enhancing.    -Blood-work 09/2021:  ACE nl, IL-2 nl, AI myelopathy panel neg, B12 hi, B6 nl, B1 nl, Rf nl, APLS nl, folate nl, vitamin D  low 19.5, vitamin E nl, RPR nl, Lyme nl, HIV nl, TSH nl  -Started Kesimpta  01/21/22, tolerating well.       Interval History 10/2023:  Overall patient is doing well, but continues complaining of intermittent neuropathic pain.      Current symptoms:  Vision/double vision:  gets annual ophthalmology evals.  No issues.  Speech, swallowing problems:  No issues  Weakness:  No issues  Fatigue:  Low energy occasionally, not even once a week   Spasms:  No  issues  Tingling/numbness/pain:  Has painful neuropathy flare ups in feet to ankles lasting up to 3 days not responding to medication.  Tries to take more gabapentin , capsaicin  & lidocaine cream without effect.    Daily pain controlled with gabapentin , cymbalta .  Balance/coordination problems:  Sometimes has balance issues   Gait:  No major issues.  Sometimes has shuffling gait.  Has canes, walker and electric wheelchair at home if needed but doesn't use them.    Falls:  Had one fall over the summer, chair slipped  Bowel/bladder control problems:  Constipation, using miralax and senna.  No bladder issues.  Memory, mood:  No issues  Headaches:  No  Seizures:  No  COVID vaccine:  original shots and booster x1  Other vaccines:  flu, PNA, due for shingles   Malignancy screening:  Has PCP, FIT DNA neg 01/2022, mammogram last 03/2021 (will schedule  appointment soon)    Vitamin D :  daily supplements  Family planning:  post-menopausal       T25FW:  9 seconds (last two checks 9 > 8 seconds)    Allergies   Allergen Reactions    Codeine Hives and Swelling     Tongue swelling    Penicillins Hives and Swelling     Has patient had a PCN reaction causing immediate rash, facial/tongue/throat swelling, SOB or lightheadedness with hypotension: Yes  Has patient had a PCN reaction causing severe rash involving mucus membranes or skin necrosis: No  Has patient had a PCN reaction that required hospitalization No  Has patient had a PCN reaction occurring within the last 10 years: Yes  If all of the above answers are NO, then may proceed with Cephalosporin use.        Current Outpatient Medications   Medication Sig Dispense Refill    albuterol  HFA 90 mcg/actuation inhaler INHALE 2 PUFFS EVERY 6 HOURS AS NEEDED FOR WHEEZE OR FOR SHORTNESS OF BREATH 8 g 0    alendronate  (FOSAMAX ) 70 MG tablet TAKE 1 TABLET BY MOUTH EVERY SEVEN DAYS. 12 tablet 3    atorvastatin  (LIPITOR) 10 MG tablet TAKE 1 TABLET BY MOUTH EVERY DAY 90 tablet 3    capsaicin  0.1 % topical cream Apply to painful areas on legs as needed.  Wear gloves when applying. 42.5 g 5    cyanocobalamin, vitamin B-12, (VITAMIN B-12 ORAL) Take by mouth. doxycycline  (VIBRAMYCIN ) 100 MG capsule Take 1 capsule (100 mg total) by mouth two (2) times a day. 20 capsule 0    DULoxetine  (CYMBALTA ) 30 MG capsule Take 60mg  (2 pills) in the morning and 30mg  (one pill) at night 270 capsule 5    empty container Misc Use as directed to dispose of injectable medications 1 each 3    gabapentin  (NEURONTIN ) 600 MG tablet Take 2 capsules in the morning, 1 capsule in the afternoon and 1 capsule in the evening. Can take 1 extra capsule during the day if needed. 450 tablet 5    hydroCHLOROthiazide  (HYDRODIURIL ) 25 MG tablet TAKE 1 TABLET (25 MG TOTAL) BY MOUTH DAILY. 90 tablet 3    losartan  (COZAAR ) 100 MG tablet TAKE 1 TABLET BY MOUTH EVERY DAY 90 tablet 1    moxifloxacin  (VIGAMOX ) 0.5 % ophthalmic solution Administer 1 drop to both eyes Three (3) times a day. 3 mL 0    multivitamin (TAB-A-VITE/THERAGRAN) per tablet Take 1 tablet by mouth daily.      ofatumumab  (KESIMPTA  PEN) 20 mg/0.4 mL PnIj Inject the contents of 1 pen subcutaneously every 4 weeks. 1.2 mL 3    traZODone  (DESYREL ) 50 MG tablet TAKE 1 TABLET BY MOUTH EVERY DAY AT NIGHT 90 tablet 1    losartan  (COZAAR ) 50 MG tablet Take 1 tablet (50 mg total) by mouth daily. (Patient not taking: Reported on 09/25/2022) 90 tablet 3     No current facility-administered medications for this visit.       Past Medical History:   Diagnosis Date    Asthma (HHS-HCC)     COPD (chronic obstructive pulmonary disease)    (CMS-HCC)     CVA (cerebral vascular accident)    (CMS-HCC)     Multiple sclerosis    (CMS-HCC)     Tobacco use        Past Surgical History:   Procedure Laterality Date    CESAREAN SECTION      HYSTERECTOMY      OOPHORECTOMY  TOTAL ABDOMINAL HYSTERECTOMY Bilateral 2003       Social History     Socioeconomic History    Marital status: Widowed     Spouse name: None    Number of children: None    Years of education: None    Highest education level: None   Tobacco Use    Smoking status: Former     Current packs/day: 0.00     Types: Cigarettes     Quit date: 10/12/2019     Years since quitting: 4.0     Passive exposure: Never    Smokeless tobacco: Never   Vaping Use    Vaping status: Never Used   Substance and Sexual Activity    Alcohol use: Yes     Comment: occassional, social celebrations    Drug use: Never    Sexual activity: Not Currently     Social Drivers of Health     Financial Resource Strain: Low Risk  (09/25/2022)    Overall Financial Resource Strain (CARDIA)     Difficulty of Paying Living Expenses: Not hard at all   Food Insecurity: No Food Insecurity (09/25/2022)    Hunger Vital Sign     Worried About Running Out of Food in the Last Year: Never true     Ran Out of Food in the Last Year: Never true   Transportation Needs: No Transportation Needs (09/25/2022)    PRAPARE - Therapist, art (Medical): No     Lack of Transportation (Non-Medical): No   Physical Activity: Sufficiently Active (09/25/2022)    Exercise Vital Sign     Days of Exercise per Week: 7 days     Minutes of Exercise per Session: 30 min   Stress: No Stress Concern Present (09/25/2022)    [employer] of Occupational Health - Occupational Stress Questionnaire     Feeling of Stress : Not at all   Social Connections: Moderately Integrated (09/25/2022)    Social Connection and Isolation Panel     Frequency of Communication with Friends and Family: More than three times a week     Frequency of Social Gatherings with Friends and Family: More than three times a week     Attends Religious Services: More than 4 times per year     Active Member of [employer] or Organizations: Yes     Attends Banker Meetings: More than 4 times per year     Marital Status: Widowed   Housing: Low Risk  (09/25/2022)    Housing     Within the past 12 months, have you ever stayed: outside, in a car, in a tent, in an overnight shelter, or temporarily in someone else's home (i.e. couch-surfing)?: No     Are you worried about losing your housing?: No       Family History Problem Relation Age of Onset    Breast cancer Mother 82    Cancer Mother         BREAST CANCER        Review of Systems     A 10-system review of systems was conducted and was negative except as documented above in the HPI.       Objective        Vital signs: BP 132/62 (BP Site: L Arm, BP Position: Sitting, BP Cuff Size: Large)  - Pulse 88  - Temp 36.3 ??C (97.4 ??F) (Temporal)  - Resp 16  - Ht 149.9 cm (4' 11)  -  Wt 78.5 kg (173 lb 1.6 oz)  - BMI 34.96 kg/m??      Physical Exam:  General Appearance: well appearing. Normal skin color, afebrile.  Heart and lungs: Warm and well perfused. Normal work of breathing. Abdomen: Soft, non-tender.      NEUROLOGICAL EXAMINATION:   General:  Alert and oriented to person, place, time and situation.    Recent and remote memory intact.    Attention span and concentration normal.    Language and spontaneous speech normal, no dysarthria or aphasia.   Fund of knowledge normal.    Cranial Nerves:   II, III- Pupils are equal and reactive to light b/l (direct and consensual reactions). No visual field defect.    III, IV, VI- extra ocular movements are intact, No ptosis, no diplopia, no nystagmus.  V- sensation of the face intact b/l.   VII- face symmetrical, no facial droop, normal facial movements with smile/grimace  VIII- Hearing grossly intact. Weber test: sound is symmetrical with no lateralization.  IX and X- symmetric palate contraction, no dysarthria, no dysphagia.  XI- Full shoulder shrug bilaterally; no wasting, normal tone and strength of sternocleidomastoid muscles bilaterally.  XII- No tongue atrophy, no tongue fasciculations; tongue protrudes midline, full range of movements of the tongue.    Neck: flexion normal.    Motor Exam:   Normal bulk and tone.  No adventitious movements.  Muscle strength:  RUE:  5/5 throughout  LUE:  5/5 throughout  RLE:  5/5 throughout  LLE:  5/5 throughout  McArdle sign negative.  Position test UE: no pronator drift b/l.     Reflexes R L Biceps +2 brisk +2 brisk   Brachioradialis  +2 brisk +2brisk   Triceps +2brisk +2brisk   Patella +3 +3   Achilles +4 +4   4-5 beats clonus at ankles BL  Suprapatellar, crossed adductor positive BL.  Normal tone b/l. Negative Babinski and Hoffman sign bilaterally.    Sensory system:  Superficial light touch sensation: wnl bl UE/LE  Vibration sense:  Normal BLUE, RLE.  Absent left toe, normal left ankle.  Position sense: previously, wnl UE/LE  Pinprick test for pain sensation:  wnl bl UE.  Diminished right foot and left forefoot.    Temperature: wnl UE/LE    Cerebellar/Coordination:  Rapid alternating movements, finger-to-nose and heel-to-shin  bilaterally demonstrate no abnormalities. No limb ataxia bilaterally. No gait ataxia. Romberg positive. Unable to perform tandem gait.    Gait:   Wide base, short stride, right foot turned out, slow unsteady turning.         Diagnostic Studies and Review of Records     EMG 06/28/21  Normal study. There is no electrodiagnostic evidence of large fiber polyneuropathy. Small fiber neuropathy is not assessed by these techniques.     MRI T-spine and c-spine w wo 06/25/2020  Spinal cord abnormal T6-7 with increased signal intensity in the dorsal columns on noncontrast sequence and foci of enhancement at T3-4 and T6-7. Recommend comparison with prior imaging in order to determine if this is the same location as prior abnormality felt to be related to B12 deficiency.     1. Faint, symmetric linear signal throughout the cervical spine suggestive of subacute combined degeneration/B12 deficiency. Please see also T-S report.   2. Multilevel neural foraminal narrowing, moderate and greatest bilaterally at C3-4 on the right and C5-C6.   3. Multilevel degenerative disc disease, moderate and greatest at C5-C6 with associated moderate central canal stenosis.  MRI full neuro-axis w wo 10/23/21  Similar appearance of T2 signal abnormality within the thoracic cord from T7 through T8. This is indeterminate in nature and could represent sequela of prior insult or demyelinating lesion.      Previous enhancing foci are not visualized in this study.     Impression   Multiple bilateral subcortical and periventricular white matter foci of T2/FLAIR hyperintensity in a pattern consistent with multiple sclerosis.     MRI full Neuro-axis w/wo 08/2022  --Unchanged extent of the cord signal abnormality extending from T6-T8. No new cord signal abnormality or abnormal enhancement.   --No definite cervical spinal cord signal abnormality although evaluation is somewhat limited due to motion artifact.   Stable findings of multiple sclerosis. No new or enhancing lesions.      Stable mild global cerebral atrophy.

## 2023-10-16 NOTE — Unmapped (Signed)
 [facility] Specialty and Home Delivery Pharmacy Refill Coordination Note    Ryker Pherigo, DOB: 07-12-1955  Phone: There are no phone numbers on file.      All above HIPAA information was verified with patient.         10/15/2023     5:11 PM   Specialty Rx Medication Refill Questionnaire   Which Medications would you like refilled and shipped? Kesimpta  Pen 20mg    If medication refills are not needed at this time, please indicate the reason below. Need   Please list all current allergies: Codeine/Pencillen   Have you missed any doses in the last 30 days? No   Have you had any changes to your medication(s) since your last refill? No   How much of each medication do you have remaining at home? (eg. number of tablets, injections, etc.) None   If receiving an injectable medication, next injection date is 10/27/2023   Have you experienced any side effects in the last 30 days? No   Please enter the full address (street address, city, state, zip code) where you would like your medication(s) to be delivered to. [address] [place] [address]   Please specify on which day you would like your medication(s) to arrive. Note: if you need your medication(s) within 3 days, please call the pharmacy to schedule your order at [phone]  10/23/2023   Has your insurance changed since your last refill? No   Would you like a pharmacist to call you to discuss your medication(s)? No   Do you require a signature for your package? (Note: if we are billing Medicare Part B or your order contains a controlled substance, we will require a signature) No   I have been provided my out of pocket cost for my medication and approve the pharmacy to charge the amount to my credit card on file. Yes   Additional Comments: None         Completed refill call assessment today to schedule patient's medication shipment from the [facility] Specialty and Home Delivery Pharmacy [phone]).  All relevant notes have been reviewed.       Confirmed patient received a Conservation officer, historic buildings and a Surveyor, mining with first shipment. The patient will receive a drug information handout for each medication shipped and additional FDA Medication Guides as required.         REFERRAL TO PHARMACIST     Referral to the pharmacist: Not needed      [facility]     Shipping address confirmed in Epic.     Delivery Scheduled: Yes, Expected medication delivery date: 10/23/23 .     Medication will be delivered via Same Day Courier to the prescription address in Epic WAM.    [name]   [facility] Specialty and Home Delivery Pharmacy Specialty Technician

## 2023-10-23 ENCOUNTER — Ambulatory Visit: Admit: 2023-10-23 | Discharge: 2023-10-24 | Payer: Medicare (Managed Care)

## 2023-10-23 DIAGNOSIS — G35 Multiple sclerosis: Principal | ICD-10-CM

## 2023-10-23 DIAGNOSIS — E559 Vitamin D deficiency, unspecified: Principal | ICD-10-CM

## 2023-10-23 MED ORDER — NALTREXONE 1.5 MG CAPSULE
ORAL_CAPSULE | Freq: Every day | ORAL | 6 refills | 0.00000 days | Status: CN
Start: 2023-10-23 — End: ?

## 2023-10-23 MED FILL — KESIMPTA PEN 20 MG/0.4 ML SUBCUTANEOUS PEN INJECTOR: 28 days supply | Qty: 0.4 | Fill #1

## 2023-10-23 NOTE — Unmapped (Addendum)
 You were seen in [facility] Neurology clinic today for MS.    The plan is:  - Continue Kesimpta  monthly injections  - For neuropathic pain:  start low dose naltrexone   - referral to Physical Therapy  - Order for orthopedic shoes and podiatry  - Continue Vitamin D  OTC 4000 units daily    - Annual surveillance MRI brain, c-spine, t-spine ordered.  The scheduler will call you to set up this appointment.  If you do not hear from them, call [phone].  - 6 month monitoring labs at next [facility] visit:  CBC w diff, IgM, IgG, vitamin D     If you have any questions, the best way to contact me is by sending a MyChart message, but you can always call the office and leave a message for me at [phone].    Comer Gleason MD  [facility] Neurology Resident

## 2023-10-24 MED ORDER — NALTREXONE 1.5 MG CAPSULE
ORAL_CAPSULE | ORAL | 5 refills | 0.00000 days | Status: CP
Start: 2023-10-24 — End: ?

## 2023-10-24 MED ORDER — GABAPENTIN 600 MG TABLET
ORAL_TABLET | ORAL | 5 refills | 0.00000 days | Status: CP
Start: 2023-10-24 — End: ?

## 2023-10-24 NOTE — Unmapped (Signed)
 Addended by: Lorita Forinash J on: 10/24/2023 04:22 PM     Modules accepted: Orders

## 2023-10-25 NOTE — Unmapped (Signed)
 I saw and evaluated the patient, participating in the key portions of the service.  I reviewed the resident???s note.  I agree with the resident???s findings and plan. Corean CHRISTELLA Pretzel, MD

## 2023-11-11 NOTE — Unmapped (Signed)
 [facility] Specialty and Home Delivery Pharmacy Refill Coordination Note    Catherine Campos, DOB: 11/23/1955  Phone: There are no phone numbers on file.      All above HIPAA information was verified with patient.         11/11/2023    10:17 AM   Specialty Rx Medication Refill Questionnaire   Which Medications would you like refilled and shipped? Kesimpta  Pen 20mg    If medication refills are not needed at this time, please indicate the reason below. Need medication   Please list all current allergies: Codeine/Pencillen   Have you missed any doses in the last 30 days? No   Have you had any changes to your medication(s) since your last refill? No   How much of each medication do you have remaining at home? (eg. number of tablets, injections, etc.) None   If receiving an injectable medication, next injection date is 11/26/2023   Have you experienced any side effects in the last 30 days? No   Please enter the full address (street address, city, state, zip code) where you would like your medication(s) to be delivered to. [address] [place] [address]   Please specify on which day you would like your medication(s) to arrive. Note: if you need your medication(s) within 3 days, please call the pharmacy to schedule your order at [phone]  11/24/2023   Has your insurance changed since your last refill? No   Would you like a pharmacist to call you to discuss your medication(s)? No   Do you require a signature for your package? (Note: if we are billing Medicare Part B or your order contains a controlled substance, we will require a signature) No   I have been provided my out of pocket cost for my medication and approve the pharmacy to charge the amount to my credit card on file. Yes         Completed refill call assessment today to schedule patient's medication shipment from the [facility] and Home Delivery Pharmacy [phone]).  All relevant notes have been reviewed.       Confirmed patient received a Conservation officer, historic buildings and a Surveyor, mining with first shipment. The patient will receive a drug information handout for each medication shipped and additional FDA Medication Guides as required.         REFERRAL TO PHARMACIST     Referral to the pharmacist: Not needed      [facility]     Shipping address confirmed in Epic.     Delivery Scheduled: Yes, Expected medication delivery date: 11/24/23 .     Medication will be delivered via Same Day Courier to the prescription address in Epic WAM.    [name]   [facility] Specialty and Home Delivery Pharmacy Specialty Technician

## 2023-11-24 MED FILL — KESIMPTA PEN 20 MG/0.4 ML SUBCUTANEOUS PEN INJECTOR: SUBCUTANEOUS | 28 days supply | Qty: 0.4 | Fill #2

## 2023-12-16 NOTE — Progress Notes (Signed)
 [facility] Specialty and Home Delivery Pharmacy Refill Coordination Note    Catherine Campos, DOB: [birth year]  Phone: There are no phone numbers on file.      All above HIPAA information was verified with patient.         12/16/2023     9:02 AM   Specialty Rx Medication Refill Questionnaire   Which Medications would you like refilled and shipped? Kesimpta  Pen 20mg . None on hand   If medication refills are not needed at this time, please indicate the reason below. Need refills   Please list all current allergies: Codeine/Pencillen   Have you missed any doses in the last 30 days? No   Have you had any changes to your medication(s) since your last refill? No   How much of each medication do you have remaining at home? (eg. number of tablets, injections, etc.) None   If receiving an injectable medication, next injection date is 12/27/2023   Have you experienced any side effects in the last 30 days? No   Please enter the full address (street address, city, state, zip code) where you would like your medication(s) to be delivered to. [address] [place] [address]   Please specify on which day you would like your medication(s) to arrive. Note: if you need your medication(s) within 3 days, please call the pharmacy to schedule your order at [phone]  12/25/2023   Has your insurance changed since your last refill? No   Would you like a pharmacist to call you to discuss your medication(s)? No   Do you require a signature for your package? (Note: if we are billing Medicare Part B or your order contains a controlled substance, we will require a signature) No   I have been provided my out of pocket cost for my medication and approve the pharmacy to charge the amount to my credit card on file. No   Additional Comments: None         Completed refill call assessment today to schedule patient's medication shipment from the [facility] Specialty and Home Delivery Pharmacy [phone]).  All relevant notes have been reviewed. Confirmed patient received a Conservation Officer, Historic Buildings and a Surveyor, Mining with first shipment. The patient will receive a drug information handout for each medication shipped and additional FDA Medication Guides as required.         REFERRAL TO PHARMACIST     Referral to the pharmacist: Not needed      [facility]     Shipping address confirmed in Epic.     Delivery Scheduled: Yes, Expected medication delivery date: 12/25/23 .     Medication will be delivered via Same Day Courier to the prescription address in Epic WAM.    [name]   [facility] Specialty and Home Delivery Pharmacy Specialty Technician

## 2023-12-25 MED FILL — KESIMPTA PEN 20 MG/0.4 ML SUBCUTANEOUS PEN INJECTOR: SUBCUTANEOUS | 28 days supply | Qty: 0.4 | Fill #3

## 2024-01-09 DIAGNOSIS — H109 Unspecified conjunctivitis: Principal | ICD-10-CM

## 2024-01-09 MED ORDER — MOXIFLOXACIN 0.5 % EYE DROPS
Freq: Three times a day (TID) | OPHTHALMIC | 0 refills | 0.00000 days
Start: 2024-01-09 — End: ?

## 2024-01-11 MED ORDER — MOXIFLOXACIN 0.5 % EYE DROPS
Freq: Three times a day (TID) | OPHTHALMIC | 0 refills | 20.00000 days
Start: 2024-01-11 — End: ?

## 2024-01-12 ENCOUNTER — Encounter
Admit: 2024-01-12 | Discharge: 2024-01-12 | Payer: Medicare (Managed Care) | Attending: Family Medicine | Primary: Family Medicine

## 2024-01-12 DIAGNOSIS — R7303 Prediabetes: Principal | ICD-10-CM

## 2024-01-12 DIAGNOSIS — J0100 Acute maxillary sinusitis, unspecified: Principal | ICD-10-CM

## 2024-01-12 DIAGNOSIS — I10 Essential (primary) hypertension: Principal | ICD-10-CM

## 2024-01-12 DIAGNOSIS — F5101 Primary insomnia: Principal | ICD-10-CM

## 2024-01-12 DIAGNOSIS — Z23 Encounter for immunization: Principal | ICD-10-CM

## 2024-01-12 DIAGNOSIS — E785 Hyperlipidemia, unspecified: Principal | ICD-10-CM

## 2024-01-12 DIAGNOSIS — G35D Multiple sclerosis: Principal | ICD-10-CM

## 2024-01-12 LAB — COMPREHENSIVE METABOLIC PANEL
ALBUMIN: 3.8 g/dL (ref 3.4–5.0)
ALKALINE PHOSPHATASE: 82 U/L (ref 46–116)
ALT (SGPT): 9 U/L — ABNORMAL LOW (ref 10–49)
ANION GAP: 9 mmol/L (ref 5–14)
AST (SGOT): 16 U/L (ref <=34)
BILIRUBIN TOTAL: 0.6 mg/dL (ref 0.3–1.2)
BLOOD UREA NITROGEN: 8 mg/dL — ABNORMAL LOW (ref 9–23)
BUN / CREAT RATIO: 9
CALCIUM: 9.3 mg/dL (ref 8.7–10.4)
CHLORIDE: 106 mmol/L (ref 98–107)
CO2: 31 mmol/L (ref 20.0–31.0)
CREATININE: 0.87 mg/dL (ref 0.55–1.02)
EGFR CKD-EPI (2021) FEMALE: 73 mL/min/1.73m2 (ref >=60)
GLUCOSE RANDOM: 105 mg/dL (ref 70–179)
POTASSIUM: 4.3 mmol/L (ref 3.4–4.8)
PROTEIN TOTAL: 7.4 g/dL (ref 5.7–8.2)
SODIUM: 146 mmol/L — ABNORMAL HIGH (ref 135–145)

## 2024-01-12 LAB — LIPID PANEL
CHOLESTEROL: 195 mg/dL (ref <200)
HDL CHOLESTEROL: 53 mg/dL (ref >50)
LDL CHOLESTEROL CALCULATED: 122 mg/dL — ABNORMAL HIGH (ref <100)
NON-HDL CHOLESTEROL: 142 mg/dL — ABNORMAL HIGH (ref <130)
TRIGLYCERIDES: 118 mg/dL (ref <150)

## 2024-01-12 LAB — HEMOGLOBIN A1C
ESTIMATED AVERAGE GLUCOSE: 117 mg/dL
HEMOGLOBIN A1C: 5.7 % — ABNORMAL HIGH (ref 4.8–5.6)

## 2024-01-12 MED ORDER — ATORVASTATIN 10 MG TABLET
ORAL_TABLET | Freq: Every day | ORAL | 1 refills | 90.00000 days | Status: CP
Start: 2024-01-12 — End: 2024-01-12

## 2024-01-12 MED ORDER — DOXYCYCLINE HYCLATE 100 MG CAPSULE
ORAL_CAPSULE | Freq: Two times a day (BID) | ORAL | 0 refills | 10.00000 days
Start: 2024-01-12 — End: ?

## 2024-01-12 NOTE — Telephone Encounter (Signed)
 Not needed

## 2024-01-12 NOTE — Telephone Encounter (Signed)
 Unable to complete refill request, medication is not included in the refill protocol.   Please review below request.     Patient is requesting the following refill  Requested Prescriptions     Pending Prescriptions Disp Refills    doxycycline  (VIBRAMYCIN ) 100 MG capsule [Pharmacy Med Name: DOXYCYCLINE  HYCLATE 100 MG CAP] 20 capsule 0     Sig: TAKE 1 CAPSULE BY MOUTH TWO TIMES A DAY.     Signed Prescriptions Disp Refills    atorvastatin  (LIPITOR) 10 MG tablet 90 tablet 1     Sig: TAKE 1 TABLET BY MOUTH EVERY DAY     Authorizing Provider: SERVANDO HIRE     Ordering User: LORELI SHERLEAN DEL       Recent Visits  Date Type Provider Dept   07/01/23 Office Visit Servando Hire, MD [facility] [address] [place]   Showing recent visits within past 365 days and meeting all other requirements  Today's Visits  Date Type Provider Dept   01/12/24 Appointment Servando Hire, MD [facility] [address] [place]   Showing today's visits and meeting all other requirements  Future Appointments  No visits were found meeting these conditions.  Showing future appointments within next 365 days and meeting all other requirements       Labs: Not applicable this refill

## 2024-01-12 NOTE — Progress Notes (Signed)
 Assessment and Plan:     Assessment & Plan  Essential hypertension  Blood pressure is well-controlled at 129/72 mmHg.    Primary insomnia  Current trazodone  15 mg is ineffective, with sleep onset at 10:30 PM and waking at 3:00 AM.  - Increase trazodone  dose to 100 mg and monitor effectiveness.  - Consider alternative medication if increased dose is ineffective.    Hyperlipidemia  Cholesterol levels have not been checked in two years.  - Ordered blood work to check cholesterol levels.    Allergic conjunctivitis  Morning eye matting likely due to allergies. Previous eye drops were for bacterial conjunctivitis.  - Use over-the-counter allergy medications for symptom relief.    General Health Maintenance  Due for flu and COVID vaccinations.  - Administered flu and COVID vaccinations.               PHQ-2 Score: 0    PHQ-9 Score:      Edinburgh Score:      Screening complete, no depression identified / no further action needed today      I personally spent 30 minutes face-to-face and non-face-to-face in the care of this patient, which includes all pre, intra, and post visit time (reviewing, evaluating and discussing pertinent records for patient's care) on the date of service.       Return in about 6 months (around 07/12/2024) for Recheck.    Subjective:     HPI: Catherine Campos is a 68 y.o. female here for        History of Present Illness  Catherine Campos is a 68 year old female who presents for a regular follow-up visit.    Her recent blood pressure reading was 129/72 mmHg.    She is experiencing issues with scheduling her mammogram due to previous orders being marked as duplicates, and she has not received a call for the appointment. Her daughter-in-law is assisting with resolving this issue.    She received a refill for doxycycline , which she does not currently need, as it was part of a general refill request. She typically receives a three-month supply of her medications and orders refills when she has about two weeks left.    She takes trazodone  15 mg to aid sleep but reports waking up at 3 AM and being unable to return to sleep.    She needs a refill for eye drops initially prescribed for bacterial conjunctivitis, as her eyes are matted in the mornings. She experiences indoor allergies, particularly in the winter.    It has been two years since her cholesterol was last checked.    Socially, she recently hosted family for Thanksgiving and plans to visit her son in [place], [place] , for Christmas.         ROS:   Review of Systems     Review of systems negative unless otherwise noted as per HPI.      The following portions of the patient's history were reviewed and updated as appropriate: allergies, current medications, past family history, past medical history, past social history, past surgical history and problem list.     Objective:     Vitals:    01/12/24 1334   BP: 129/72   Pulse: 96   Temp: 37.1 ??C (98.7 ??F)     Body mass index is 35.55 kg/m??.    Physical Exam  Vitals and nursing note reviewed.   Constitutional:       General: She is not in acute distress.  Appearance: Normal appearance. She is not ill-appearing, toxic-appearing or diaphoretic.   Cardiovascular:      Rate and Rhythm: Normal rate and regular rhythm.      Heart sounds: Normal heart sounds.   Pulmonary:      Effort: Pulmonary effort is normal. No respiratory distress.      Breath sounds: Normal breath sounds.   Musculoskeletal:      Cervical back: Neck supple.   Neurological:      Mental Status: She is alert.                Results             Allergies:     Codeine and Penicillins      THEODORO JUST, MD    [facility]:     Medication adherence and barriers to the treatment plan have been addressed. Opportunities to optimize healthy behaviors have been discussed. Patient / caregiver voiced understanding.

## 2024-01-14 DIAGNOSIS — I10 Essential (primary) hypertension: Principal | ICD-10-CM

## 2024-01-14 MED ORDER — LOSARTAN 100 MG TABLET
ORAL_TABLET | Freq: Every day | ORAL | 3 refills | 90.00000 days | Status: CP
Start: 2024-01-14 — End: ?

## 2024-01-14 NOTE — Telephone Encounter (Signed)
 Unable to complete refill request, due to duplicate therapy, please advise on how to proceed.  Please advise on how to proceed.

## 2024-01-20 NOTE — Progress Notes (Signed)
 [facility] Specialty and Home Delivery Pharmacy Refill Coordination Note    Catherine Campos, DOB: 15-May-1955  Phone: There are no phone numbers on file.      All above HIPAA information was verified with patient.         01/20/2024    11:01 AM   Specialty Rx Medication Refill Questionnaire   Which Medications would you like refilled and shipped? Kesimpta  Pen 20mg    If medication refills are not needed at this time, please indicate the reason below. None   Please list all current allergies: Codeine  Pencillen   Have you missed any doses in the last 30 days? No   Have you had any changes to your medication(s) since your last refill? No   How much of each medication do you have remaining at home? (eg. number of tablets, injections, etc.) None   If receiving an injectable medication, next injection date is 01/27/2024   Have you experienced any side effects in the last 30 days? No   Please enter the full address (street address, city, state, zip code) where you would like your medication(s) to be delivered to. [address] [place] [address]   Please specify on which day you would like your medication(s) to arrive. Note: if you need your medication(s) within 3 days, please call the pharmacy to schedule your order at [phone]  01/28/2024   Has your insurance changed since your last refill? No   Would you like a pharmacist to call you to discuss your medication(s)? No   Do you require a signature for your package? (Note: if we are billing Medicare Part B or your order contains a controlled substance, we will require a signature) No   I have been provided my out of pocket cost for my medication and approve the pharmacy to charge the amount to my credit card on file. Yes   Additional Comments: None         Completed refill call assessment today to schedule patient's medication shipment from the [facility] Specialty and Home Delivery Pharmacy [phone]).  All relevant notes have been reviewed.       Confirmed patient received a Conservation Officer, Historic Buildings and a Surveyor, Mining with first shipment. The patient will receive a drug information handout for each medication shipped and additional FDA Medication Guides as required.         REFERRAL TO PHARMACIST     Referral to the pharmacist: Not needed      [facility]     Shipping address confirmed in Epic.     Delivery Scheduled: Yes, Expected medication delivery date: 01/28/24 .     Medication will be delivered via Next Day Courier to the prescription address in Epic WAM.    [name]   [facility] Specialty and Home Delivery Pharmacy Specialty Technician

## 2024-01-25 MED FILL — KESIMPTA PEN 20 MG/0.4 ML SUBCUTANEOUS PEN INJECTOR: SUBCUTANEOUS | 28 days supply | Qty: 0.4 | Fill #4

## 2024-02-06 DIAGNOSIS — F5101 Primary insomnia: Principal | ICD-10-CM

## 2024-02-06 MED ORDER — TRAZODONE 50 MG TABLET
ORAL_TABLET | Freq: Every evening | ORAL | 1 refills | 0.00000 days
Start: 2024-02-06 — End: ?

## 2024-02-08 MED ORDER — TRAZODONE 50 MG TABLET
ORAL_TABLET | Freq: Every evening | ORAL | 1 refills | 90.00000 days | Status: CP
Start: 2024-02-08 — End: ?

## 2024-02-08 NOTE — Telephone Encounter (Signed)
 Patient is requesting the following refill  Requested Prescriptions     Pending Prescriptions Disp Refills    traZODone  (DESYREL ) 50 MG tablet [Pharmacy Med Name: TRAZODONE  50 MG TABLET] 90 tablet 1     Sig: TAKE 1 TABLET BY MOUTH EVERY DAY AT NIGHT       Recent Visits  Date Type Provider Dept   01/12/24 Office Visit Servando Hire, MD [facility] [address] [place] [address] [facility] [place]   07/01/23 Office Visit Servando Hire, MD [facility] [address] [place]   Showing recent visits within past 365 days and meeting all other requirements  Future Appointments  Date Type Provider Dept   07/12/24 Appointment Servando Hire, MD [facility] [address] [place]   Showing future appointments within next 365 days and meeting all other requirements       Labs: Not applicable this refill

## 2024-02-23 NOTE — Progress Notes (Signed)
 [facility] Specialty and Home Delivery Pharmacy Refill Coordination Note    Catherine Campos, DOB: [birth year]  Phone: There are no phone numbers on file.      All above HIPAA information was verified with patient.         02/20/2024     2:53 PM   Specialty Rx Medication Refill Questionnaire   Which Medications would you like refilled and shipped? Kesimpta  Pen 20mg    If medication refills are not needed at this time, please indicate the reason below. Need medication   Please list all current allergies: Codeine/Pencillen   Have you missed any doses in the last 30 days? No   Have you had any changes to your medication(s) since your last refill? No   How much of each medication do you have remaining at home? (eg. number of tablets, injections, etc.) None   If receiving an injectable medication, next injection date is 02/26/2024   Have you experienced any side effects in the last 30 days? No   Please enter the full address (street address, city, state, zip code) where you would like your medication(s) to be delivered to. [address] [place] [address]   Please specify on which day you would like your medication(s) to arrive. Note: if you need your medication(s) within 3 days, please call the pharmacy to schedule your order at [phone]  02/24/2024   Has your insurance changed since your last refill? Yes   If YES, please enter your new insurance information (include BIN, PCN, RX Group, and Member ID). [employer]  [id number]   Would you like a pharmacist to call you to discuss your medication(s)? No   Do you require a signature for your package? (Note: if we are billing Medicare Part B or your order contains a controlled substance, we will require a signature) No   I have been provided my out of pocket cost for my medication and approve the pharmacy to charge the amount to my credit card on file. Yes   Additional Comments: None         Completed refill call assessment today to schedule patient's medication shipment from the [facility] Specialty and Home Delivery Pharmacy [phone]).  All relevant notes have been reviewed.       Confirmed patient received a Conservation Officer, Historic Buildings and a Surveyor, Mining with first shipment. The patient will receive a drug information handout for each medication shipped and additional FDA Medication Guides as required.         REFERRAL TO PHARMACIST     Referral to the pharmacist: Not needed      [facility]     Shipping address confirmed in Epic.     Delivery Scheduled: Yes, Expected medication delivery date: 02/25/24.     Medication will be delivered via Next Day Courier to the prescription address in Epic WAM.    Vernell VEAR Gull, PharmD   [facility] Specialty and Home Delivery Pharmacy Specialty Pharmacist

## 2024-02-24 MED FILL — KESIMPTA PEN 20 MG/0.4 ML SUBCUTANEOUS PEN INJECTOR: SUBCUTANEOUS | 28 days supply | Qty: 0.4 | Fill #5

## 2024-02-25 DIAGNOSIS — M810 Age-related osteoporosis without current pathological fracture: Principal | ICD-10-CM

## 2024-02-25 DIAGNOSIS — G35D Multiple sclerosis: Principal | ICD-10-CM

## 2024-02-25 MED ORDER — GABAPENTIN 600 MG TABLET
ORAL_TABLET | ORAL | 5 refills | 0.00000 days | Status: CP
Start: 2024-02-25 — End: ?

## 2024-02-25 MED ORDER — ALENDRONATE 70 MG TABLET
ORAL_TABLET | ORAL | 3 refills | 84.00000 days | Status: CP
Start: 2024-02-25 — End: ?

## 2024-02-25 NOTE — Telephone Encounter (Signed)
 Patient is requesting the following refill  Requested Prescriptions     Pending Prescriptions Disp Refills    gabapentin  (NEURONTIN ) 600 MG tablet 450 tablet 5     Sig: Take 2 capsules in the morning, 1 capsule in the afternoon and 1 capsule in the evening. Can take 1 extra capsule during the day if needed.    alendronate  (FOSAMAX ) 70 MG tablet 12 tablet 3     Sig: Take 1 tablet (70 mg total) by mouth every seven (7) days.       Recent Visits  Date Type Provider Dept   01/12/24 Office Visit Servando Hire, MD [facility] [address] [facility]   07/01/23 Office Visit Servando Hire, MD [facility] [address] [place]   Showing recent visits within past 365 days and meeting all other requirements  Future Appointments  Date Type Provider Dept   07/12/24 Appointment Servando Hire, MD [facility] [address] [place]   Showing future appointments within next 365 days and meeting all other requirements       Labs: CBC:   WBC (10*9/L)   Date Value   03/27/2023 7.7     HGB (g/dL)   Date Value   97/85/7974 14.2     HCT (%)   Date Value   03/27/2023 43.2     MCV (fL)   Date Value   03/27/2023 93.7     RDW (%)   Date Value   03/27/2023 14.9     Platelet (10*9/L)   Date Value   03/27/2023 349     Neutrophils % (%)   Date Value   03/27/2023 63.7     Lymphocytes % (%)   Date Value   03/27/2023 24.6     Monocytes % (%)   Date Value   03/27/2023 8.5     Eosinophils % (%)   Date Value   03/27/2023 2.3     Basophils % (%)   Date Value   03/27/2023 0.9    Potassium:   Potassium (mmol/L)   Date Value   01/12/2024 4.3    Vitals:   BP Readings from Last 3 Encounters:   01/12/24 129/72   10/23/23 132/62   07/01/23 108/69    and   Pulse Readings from Last 3 Encounters:   01/12/24 96   10/23/23 88   07/01/23 91

## 2024-03-02 DIAGNOSIS — G629 Polyneuropathy, unspecified: Principal | ICD-10-CM

## 2024-03-02 DIAGNOSIS — E785 Hyperlipidemia, unspecified: Principal | ICD-10-CM

## 2024-03-02 DIAGNOSIS — I10 Essential (primary) hypertension: Principal | ICD-10-CM

## 2024-03-02 DIAGNOSIS — F5101 Primary insomnia: Principal | ICD-10-CM

## 2024-03-02 MED ORDER — DULOXETINE 30 MG CAPSULE,DELAYED RELEASE
ORAL_CAPSULE | 5 refills | 0.00000 days | Status: CP
Start: 2024-03-02 — End: ?

## 2024-03-02 MED ORDER — HYDROCHLOROTHIAZIDE 25 MG TABLET
ORAL_TABLET | Freq: Every day | ORAL | 3 refills | 90.00000 days | Status: CP
Start: 2024-03-02 — End: 2024-03-02

## 2024-03-02 MED ORDER — TRAZODONE 50 MG TABLET
ORAL_TABLET | Freq: Every evening | ORAL | 1 refills | 90.00000 days | Status: CP
Start: 2024-03-02 — End: ?

## 2024-03-02 MED ORDER — ATORVASTATIN 10 MG TABLET
ORAL_TABLET | Freq: Every day | ORAL | 1 refills | 90.00000 days | Status: CP
Start: 2024-03-02 — End: 2024-03-02

## 2024-03-02 MED ORDER — LOSARTAN 100 MG TABLET
ORAL_TABLET | Freq: Every day | ORAL | 3 refills | 90.00000 days | Status: CP
Start: 2024-03-02 — End: ?

## 2024-03-02 NOTE — Telephone Encounter (Signed)
 Patient is requesting the following refill  Requested Prescriptions     Signed Prescriptions Disp Refills    atorvastatin  (LIPITOR) 10 MG tablet 90 tablet 1     Sig: Take 1 tablet (10 mg total) by mouth daily.     Authorizing Provider: SERVANDO HIRE     Ordering User: Bralee Feldt M    DULoxetine  (CYMBALTA ) 30 MG capsule 270 capsule 5     Sig: Take 60mg  (2 pills) in the morning and 30mg  (one pill) at night     Authorizing Provider: SERVANDO HIRE     Ordering User: Anarely Nicholls M    hydroCHLOROthiazide  (HYDRODIURIL ) 25 MG tablet 90 tablet 3     Sig: Take 1 tablet (25 mg total) by mouth daily.     Authorizing Provider: SERVANDO HIRE     Ordering User: Neven Fina M    losartan  (COZAAR ) 100 MG tablet 90 tablet 3     Sig: Take 1 tablet (100 mg total) by mouth daily.     Authorizing Provider: SERVANDO HIRE     Ordering User: SHLOMO ELYN HERO       Recent Visits  Date Type Provider Dept   01/12/24 Office Visit Servando Hire, MD [facility] [address] [facility]   07/01/23 Office Visit Servando Hire, MD [facility] [address] [place]   Showing recent visits within past 365 days and meeting all other requirements  Future Appointments  Date Type Provider Dept   07/12/24 Appointment Servando Hire, MD [facility] [address] [place]   Showing future appointments within next 365 days and meeting all other requirements       Labs: Cholesterol:   Cholesterol, Total (mg/dL)   Date Value   87/97/7974 195   ,   Triglycerides (mg/dL)   Date Value   87/97/7974 118   ,   Cholesterol, HDL (mg/dL)   Date Value   87/97/7974 53   ,   Cholesterol, LDL, Calculated (mg/dL)   Date Value   87/97/7974 122 (H)    Vitals:   BP Readings from Last 3 Encounters:   01/12/24 129/72   10/23/23 132/62   07/01/23 108/69    and   Pulse Readings from Last 3 Encounters:   01/12/24 96   10/23/23 88   07/01/23 91       Pharmacy change

## 2024-03-02 NOTE — Telephone Encounter (Signed)
 Rx sent.
